# Patient Record
Sex: Female | Born: 1950 | Race: Black or African American | Hispanic: No | Marital: Married | Smoking: Never smoker
Health system: Southern US, Community
[De-identification: ages and names within clinical notes are randomized; demographics above are authoritative.]

## PROBLEM LIST (undated history)

## (undated) DIAGNOSIS — I10 Essential (primary) hypertension: Secondary | ICD-10-CM

## (undated) HISTORY — PX: URETHRAL CYST REMOVAL: SHX5128

## (undated) HISTORY — PX: OTHER SURGICAL HISTORY: SHX169

## (undated) HISTORY — DX: Essential (primary) hypertension: I10

---

## 2012-05-25 HISTORY — PX: KNEE ARTHROSCOPY WITH MENISCAL REPAIR: SHX5653

## 2015-12-30 ENCOUNTER — Emergency Department (HOSPITAL_COMMUNITY)
Admission: EM | Admit: 2015-12-30 | Discharge: 2015-12-31 | Disposition: A | Discharge status: Home / Self Care | Payer: BLUE CROSS/BLUE SHIELD | Attending: Emergency Medicine | Admitting: Emergency Medicine

## 2015-12-30 ENCOUNTER — Encounter (HOSPITAL_COMMUNITY): Payer: Self-pay | Admitting: Emergency Medicine

## 2015-12-30 DIAGNOSIS — I10 Essential (primary) hypertension: Secondary | ICD-10-CM | POA: Insufficient documentation

## 2015-12-30 NOTE — ED Triage Notes (Signed)
Pt. 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 Pt. added cervical neck problems in the past .

## 2015-12-30 NOTE — ED Provider Notes (Signed)
[facility] Provider Note   CSN: [id number] Arrival date & time: 12/30/15  2006  First Provider Contact:   First MD Initiated Contact with Patient 12/31/15 0002    By signing my name below, I, Freida Busmaniana Omoyeni, attest that this documentation has been prepared under the direction and in the presence of Tomasita CrumbleAdeleke Lekisha Mcghee, MD . Electronically Signed: Freida Busmaniana Omoyeni, Scribe. 12/30/2015. 11:56 PM.   History   Chief Complaint Chief Complaint  Patient presents with  . Hypertension  . Other    " funny feeling at backm of head"     The history is provided by the patient. No language interpreter was used.     HPI Comments:  Sharon Mack is a 65 y.o. female who presents to the Emergency Department complaining of tenderness in her posterior head. She states it does not feel like a HA. She has a h/o HTN and reports associated HTN  x ~1 week. She reports BPs in the 150s/90s. Pt has a h/o similar episodes of HTN when she gained weight a few years ago; states she was taken off BP meds after she lost the weight. She recently started taking the enalapril HCTZ again when these episodes began and also notes recent slight weight gain. She denies acute trauma/fall, and numbness weakness in her extremities.   History reviewed. No pertinent past medical history.  There are no active problems to display for this patient.   Past Surgical History:  Procedure Laterality Date  . KNEE SURGERY    . URETHRAL CYST REMOVAL      OB History    No data available       Home Medications    Prior to Admission medications   Not on File    Family History No family history on file.  Social History Social History  Substance Use Topics  . Smoking status: Never Smoker  . Smokeless tobacco: Not on file  . Alcohol use Yes     Allergies   Penicillins   Review of Systems Review of Systems  10 systems reviewed and all are negative for acute change except as noted in the HPI.  Physical  Exam Updated Vital Signs BP 149/98 (BP Location: Right Arm)   Pulse 86   Temp 98.5 F (36.9 C) (Oral)   Resp 16   Ht 5\' 7"  (1.702 m)   Wt 198 lb (89.8 kg)   SpO2 99%   BMI 31.01 kg/m   Physical Exam  Constitutional: She is oriented to person, place, and time. She appears well-developed and well-nourished. No distress.  HENT:  Head: Normocephalic and atraumatic.  Nose: Nose normal.  Mouth/Throat: Oropharynx is clear and moist. No oropharyngeal exudate.  Eyes: Conjunctivae and EOM are normal. Pupils are equal, round, and reactive to light. No scleral icterus.  Neck: Normal range of motion. Neck supple. No JVD present. No tracheal deviation present. No thyromegaly present.  Cardiovascular: Normal rate, regular rhythm and normal heart sounds.  Exam reveals no gallop and no friction rub.   No murmur heard. Pulmonary/Chest: Effort normal and breath sounds normal. No respiratory distress. She has no wheezes. She exhibits no tenderness.  Abdominal: Soft. Bowel sounds are normal. She exhibits no distension and no mass. There is no tenderness. There is no rebound and no guarding.  Musculoskeletal: Normal range of motion. She exhibits no edema or tenderness.  Lymphadenopathy:    She has no cervical adenopathy.  Neurological: She is alert and oriented to person, place, and time. No cranial  nerve deficit. She exhibits normal muscle tone.  Normal strength and sensation to all extremities Normal cerebellar testing  Skin: Skin is warm and dry. No rash noted. No erythema. No pallor.  Nursing note and vitals reviewed.    ED Treatments / Results  DIAGNOSTIC STUDIES:  Oxygen Saturation is 100% on RA, normal by my interpretation.    COORDINATION OF CARE:  12:12 AM Discussed treatment plan with pt at bedside and pt agreed to plan.  Labs (all labs ordered are listed, but only abnormal results are displayed) Labs Reviewed - No data to display  EKG  EKG Interpretation None        Radiology No results found.  Procedures Procedures   Medications Ordered in ED Medications - No data to display   Initial Impression / Assessment and Plan / ED Course  I have reviewed the triage vital signs and the nursing notes.  Pertinent labs & imaging results that were available during my care of the patient were reviewed by me and considered in my medical decision making (see chart for details).  Clinical Course    Patient presents to the ED for pain in the back of her head with elevated BP readings.  Pain is worse with palpation.  Neuro exam here is normal. Symptoms have been going on for 1 week.  I do not believe she has an intracranial abnormality.  Repeat BP is now 120/80 but she doubled her medication today.  Advised to start amlodipine tomorrow and daily, lifestyle changes, and PCP fu.  She demonstrates good understanding. She appears well and in NAD.  VS remain within her normal limits and she is safe for DC.  Final Clinical Impressions(s) / ED Diagnoses   Final diagnoses:  None    New Prescriptions New Prescriptions   No medications on file      I personally performed the services described in this documentation, which was scribed in my presence. The recorded information has been reviewed and is accurate.      Tomasita Crumble, MD 12/31/15 [phone]

## 2015-12-31 MED ORDER — AMLODIPINE BESYLATE 10 MG PO TABS: 10.0000 mg | ORAL_TABLET | Freq: Every day | ORAL | 0 refills | Status: DC

## 2016-01-01 ENCOUNTER — Telehealth: Payer: Self-pay | Admitting: *Deleted

## 2016-01-01 ENCOUNTER — Encounter: Payer: Self-pay | Admitting: *Deleted

## 2016-01-01 NOTE — Telephone Encounter (Signed)
Pre-Visit Call completed with patient and chart updated.   Pre-Visit Info documented in Specialty Comments under [employer].    

## 2016-01-02 ENCOUNTER — Encounter: Payer: Self-pay | Admitting: Family

## 2016-01-02 ENCOUNTER — Ambulatory Visit (INDEPENDENT_AMBULATORY_CARE_PROVIDER_SITE_OTHER): Payer: PRIVATE HEALTH INSURANCE | Admitting: Family

## 2016-01-02 VITALS — BP 132/75 | HR 92 | Temp 98.5°F | Ht 65.0 in | Wt 196.2 lb

## 2016-01-02 DIAGNOSIS — IMO0001 Reserved for inherently not codable concepts without codable children: Secondary | ICD-10-CM

## 2016-01-02 DIAGNOSIS — R635 Abnormal weight gain: Secondary | ICD-10-CM | POA: Diagnosis not present

## 2016-01-02 DIAGNOSIS — I10 Essential (primary) hypertension: Secondary | ICD-10-CM | POA: Diagnosis not present

## 2016-01-02 DIAGNOSIS — R030 Elevated blood-pressure reading, without diagnosis of hypertension: Secondary | ICD-10-CM

## 2016-01-02 MED ORDER — AMLODIPINE BESYLATE 10 MG PO TABS: 10.0000 mg | ORAL_TABLET | Freq: Every day | ORAL | 1 refills | Status: DC

## 2016-01-02 NOTE — Progress Notes (Signed)
Subjective:    Patient ID: Sharon Mack, female    DOB: July 28, 1950, 65 y.o.   MRN: [id number]  HPI  Ms. Sharon EdenSlater- Licorish is a 65 yr old female who presents today to establish care. Notes that she developed a tender spot on her right scalp.   She presented to the ED on 12/30/15 with HA.  Noted to have elevated blood pressure. Was started on amlodipine.   BP Readings from Last 3 Encounters:  01/02/16 132/75  12/31/15 133/93   Reports some weight gain.  Attributes this to eating out a lot while they were building their home.  Reports that she has been monitoring her blood pressure.  She was 160lbs 2 years ago.    Seeing a chiropractor for some chronic neck pain.  Has a lot of tense muscles in her neck/back.   Wt Readings from Last 3 Encounters:  01/02/16 196 lb 3.2 oz (89 kg)  12/30/15 198 lb (89.8 kg)    Review of Systems  Constitutional: Positive for unexpected weight change.  HENT: Negative for hearing loss and rhinorrhea.   Eyes: Negative for visual disturbance.  Respiratory: Negative for cough.   Cardiovascular: Negative for leg swelling.  Gastrointestinal: Negative for blood in stool, constipation and diarrhea.  Genitourinary: Negative for dysuria and frequency.  Musculoskeletal: Positive for back pain and neck pain.       Sees a chiropractor  Skin: Negative for rash.  Neurological: Negative for headaches.  Hematological: Negative for adenopathy.  Psychiatric/Behavioral:       Denies depression/anxiety   Past Medical History:  Diagnosis Date  . Hypertension      Social History   Social History  . Marital status: Married    Spouse name: N/A  . Number of children: N/A  . Years of education: N/A   Occupational History  . Not on file.   Social History Main Topics  . Smoking status: Never Smoker  . Smokeless tobacco: Not on file  . Alcohol use No  . Drug use: No  . Sexual activity: Not on file   Other Topics Concern  . Not on file   Social  History Narrative   Married 2016   Son lives in the [place], 3 grandchildren   Retired Publishing rights managernurse practitioner (OB/GYN)   Enjoys roller skating, travelling, some tennis, watching basketball, dancing at [employer]    Past Surgical History:  Procedure Laterality Date  . KNEE ARTHROSCOPY WITH MENISCAL REPAIR  2014  . Sub Urethral Cyst    . URETHRAL CYST REMOVAL      Family History  Problem Relation Age of Onset  . Alcohol abuse Mother     died from liver cirrhosis, at age 64    Allergies  Allergen Reactions  . Penicillins Rash    Current Outpatient Prescriptions on File Prior to Visit  Medication Sig Dispense Refill  . Cholecalciferol (VITAMIN D3 PO) Take by mouth.    . Ginkgo Biloba (GINKGO PO) Take by mouth.    . MELATONIN PO Take by mouth at bedtime as needed.    Marland Kitchen. VITAMIN E PO Take by mouth.    . Coenzyme Q10 (COQ10 PO) Take by mouth.    . Flaxseed, Linseed, (FLAXSEED OIL PO) Take by mouth.     No current facility-administered medications on file prior to visit.     BP 132/75   Pulse 92   Temp 98.5 F (36.9 C) (Oral)   Ht 5\' 5"  (1.651 m)   Wt 196  lb 3.2 oz (89 kg)   SpO2 92%   BMI 32.65 kg/m       Objective:   Physical Exam  Constitutional: She is oriented to person, place, and time. She appears well-developed and well-nourished.  HENT:  Head: Normocephalic and atraumatic.  Cardiovascular: Normal rate, regular rhythm and normal heart sounds.   No murmur heard. Pulmonary/Chest: Effort normal and breath sounds normal. No respiratory distress. She has no wheezes.  Neurological: She is alert and oriented to person, place, and time.  Psychiatric: She has a normal mood and affect. Her behavior is normal. Judgment and thought content normal.          Assessment & Plan:

## 2016-01-02 NOTE — Patient Instructions (Addendum)
Complete lab work prior to leaving.  Continue amlodipine. Continue your work on healthy diet, exercise and weight loss. Welcome to [facility]!

## 2016-01-02 NOTE — Progress Notes (Signed)
Pre visit review using our clinic tool,if applicable. No additional management support is needed unless otherwise documented below in the visit note.  

## 2016-01-03 LAB — BASIC METABOLIC PANEL
BUN: 17 mg/dL (ref 6–23)
CHLORIDE: 100 meq/L (ref 96–112)
CO2: 30 meq/L (ref 19–32)
Calcium: 9.7 mg/dL (ref 8.4–10.5)
Creatinine, Ser: 0.9 mg/dL (ref 0.40–1.20)
GFR: 81.15 mL/min (ref >60.00)
GLUCOSE: 92 mg/dL (ref 70–99)
POTASSIUM: 4.1 meq/L (ref 3.5–5.1)
SODIUM: 137 meq/L (ref 135–145)

## 2016-01-04 ENCOUNTER — Encounter: Payer: Self-pay | Admitting: Family

## 2016-01-05 DIAGNOSIS — R635 Abnormal weight gain: Secondary | ICD-10-CM | POA: Insufficient documentation

## 2016-01-05 DIAGNOSIS — IMO0001 Reserved for inherently not codable concepts without codable children: Secondary | ICD-10-CM | POA: Insufficient documentation

## 2016-01-05 DIAGNOSIS — R030 Elevated blood-pressure reading, without diagnosis of hypertension: Secondary | ICD-10-CM

## 2016-01-05 NOTE — Assessment & Plan Note (Signed)
Discussed diet/exercise and weight loss.  

## 2016-01-05 NOTE — Assessment & Plan Note (Signed)
BP stable on amlodipine. Continue same.  

## 2016-01-06 ENCOUNTER — Telehealth: Payer: Self-pay | Admitting: *Deleted

## 2016-01-06 ENCOUNTER — Telehealth: Payer: Self-pay | Admitting: Family

## 2016-01-06 MED ORDER — AMLODIPINE BESYLATE 10 MG PO TABS: 10.0000 mg | ORAL_TABLET | Freq: Every day | ORAL | 1 refills | Status: DC

## 2016-01-06 NOTE — Telephone Encounter (Signed)
Pt has called in several times very rude and unprofessional yelling at front staff in regards to a Rx. Pt says per [facility], PCP didn't sign off on Rx that was sent in at time of visit so they have been unable to fill. 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    Pt called back in again yelling at front staff because office manager wasn't available to speak with her.    I spoke with CMA, she stated that she will [facility] to [facility] and find out what's going on. Informed pt, she says that this is ridiculous but she is grateful.

## 2016-01-06 NOTE — Telephone Encounter (Signed)
Received call from front office that pt was on the phone upset that her prescription for amlodipine "had not been signed off" and mail order has been trying to reach us to verify Rx. I do not see that we have received fax or electronic verification request. Advised front office to let pt know we will contact [facility] to clarify and get Rx completed. Spoke with CSR that verified Rx was received and placed on hold as it was missing Provider signature. I asked if they had reached out to us to obtain this information and she was unable to confirm. States Rx is just in pending mode. Was transferred to Sal, [facility] and he states that fax they received had no Provider signature. Gave verbal to Sal. Notified pt. She expressed concern about problems with technology and that she has been very stressed for 6 hours while she was trying to get this straightened out. I apologized to the pt for the difficulty she had experienced but I have spoken with the pharmacist personally. She states [facility] told her they contacted us 4 times for clarification. I advised her that I was not able to confirm previous contact attempts and that this was the firest time I heard there was a problem with her prescription. Again apologized to the pt for the difficulty with her prescription.

## 2016-01-31 ENCOUNTER — Ambulatory Visit: Payer: BLUE CROSS/BLUE SHIELD | Admitting: Family Medicine

## 2016-05-25 ENCOUNTER — Other Ambulatory Visit: Payer: Self-pay | Admitting: Family

## 2016-07-26 ENCOUNTER — Other Ambulatory Visit: Payer: Self-pay | Admitting: Family

## 2017-02-08 ENCOUNTER — Encounter (HOSPITAL_COMMUNITY): Payer: Self-pay | Admitting: Emergency Medicine

## 2017-02-08 ENCOUNTER — Emergency Department (HOSPITAL_COMMUNITY)
Admission: EM | Admit: 2017-02-08 | Discharge: 2017-02-08 | Disposition: A | Discharge status: Home / Self Care | Payer: BLUE CROSS/BLUE SHIELD | Attending: Emergency Medicine | Admitting: Emergency Medicine

## 2017-02-08 ENCOUNTER — Emergency Department (HOSPITAL_COMMUNITY): Payer: BLUE CROSS/BLUE SHIELD

## 2017-02-08 DIAGNOSIS — I10 Essential (primary) hypertension: Secondary | ICD-10-CM | POA: Insufficient documentation

## 2017-02-08 DIAGNOSIS — M79672 Pain in left foot: Secondary | ICD-10-CM | POA: Diagnosis present

## 2017-02-08 NOTE — ED Notes (Signed)
Pt reports she was stepping off ladder and missed last 2 steps, she heard a "pop" in her L heel. No obvious deformity, no swelling.

## 2017-02-08 NOTE — ED Provider Notes (Signed)
[facility] Provider Note   CSN: [id number] Arrival date & time: 02/08/17  1907     History   Chief Complaint Chief Complaint  Patient presents with  . Foot Injury    HPI Sharon Mack is a 66 y.o. female.  66 year old female who presents with left heel pain. This evening the patient stepped off of a ladder and missed the last 2 steps. She felt a pop on the medial side of her left heel and she has had pain and swelling there ever since. She initially put ice on it and thought that it was fine but her friend encouraged her to come in. She has full ROM of foot and ankle, no ankle or leg pain. No other injuries. No hx of trauma to foot. Pain is mild and worse with palpation.   The history is provided by the patient.  Foot Injury   Pertinent negatives include no numbness.    Past Medical History:  Diagnosis Date  . Hypertension     Patient Active Problem List   Diagnosis Date Noted  . Elevated blood pressure 01/05/2016  . Weight gain 01/05/2016    Past Surgical History:  Procedure Laterality Date  . KNEE ARTHROSCOPY WITH MENISCAL REPAIR  2014  . Sub Urethral Cyst    . URETHRAL CYST REMOVAL      OB History    No data available       Home Medications    Prior to Admission medications   Medication Sig Start Date End Date Taking? Authorizing Provider  amLODipine (NORVASC) 10 MG tablet TAKE 1 TABLET DAILY 05/26/16   Sandford Craze, NP  Black Currant Seed Oil 500 MG CAPS Take by mouth.    [provider]  Cholecalciferol (VITAMIN D3 PO) Take by mouth.    [provider]  Coenzyme Q10 (COQ10 PO) Take by mouth.    [provider]  Flaxseed, Linseed, (FLAXSEED OIL PO) Take by mouth.    [provider]  Ginkgo Biloba (GINKGO PO) Take by mouth.    [provider]  MELATONIN PO Take by mouth at bedtime as needed.    [provider]  VITAMIN E PO Take by mouth.    [provider]    Family  History Family History  Problem Relation Age of Onset  . Alcohol abuse Mother        died from liver cirrhosis, at age 45    Social History Social History  Substance Use Topics  . Smoking status: Never Smoker  . Smokeless tobacco: Never Used  . Alcohol use No     Allergies   Penicillins   Review of Systems Review of Systems  Musculoskeletal: Negative for joint swelling.  Skin: Negative for color change and wound.  Neurological: Negative for numbness.     Physical Exam Updated Vital Signs BP (!) 142/90 (BP Location: Left Arm)   Pulse 82   Temp 98.3 F (36.8 C) (Oral)   Resp 18   Ht  (1.702 m)   Wt 88.9 kg (196 lb)   SpO2 99%   BMI 30.70 kg/m   Physical Exam  Constitutional: She is oriented to person, place, and time. She appears well-developed and well-nourished. No distress.  HENT:  Head: Normocephalic and atraumatic.  Musculoskeletal: Normal range of motion. She exhibits tenderness.  Achilles tendon intact, no proximal fibular tenderness, no base of 5th metatarsal tenderness, no midfoot instability, no tenderness of 1st MTP joint Mild edema and tenderness of  medial side of heel; no tenderness along plantar surface of foot  Neurological: She is alert and oriented to person, place, and time.  Normal sensation b/l lower extremities  Skin: Skin is warm and dry. No erythema.  Psychiatric: She has a normal mood and affect. Judgment normal.  Nursing note and vitals reviewed.    ED Treatments / Results  Labs (all labs ordered are listed, but only abnormal results are displayed) Labs Reviewed - No data to display  EKG  EKG Interpretation None       Radiology Dg Foot Complete Left  Result Date: 02/08/2017 CLINICAL DATA:  Patient stepped off ladder today and heard a pop on the medial side of her left foot, medial left foot pain and minor swelling. EXAM: LEFT FOOT - COMPLETE 3+ VIEW COMPARISON:  None. FINDINGS: There are small slivers of bone along  medial margin of the base of the proximal phalanx the great toe consistent acute avulsion fractures. Mild associated soft tissue swelling. No other evidence of a fracture. There is deformity of the PIP joints of third, fourth and fifth toes, most prominently the fifth toe, which appears chronic likely developmental. Remaining joints are normally spaced and aligned IMPRESSION: 1. Small avulsion fracture from the medial base of the proximal phalanx of the left great toe. No dislocation. No other acute abnormalities. Electronically Signed   By: Amie Portland M.D.   On: 02/08/2017 19:37    Procedures Procedures (including critical care time)  Medications Ordered in ED Medications - No data to display   Initial Impression / Assessment and Plan / ED Course  I have reviewed the triage vital signs and the nursing notes.  Pertinent imaging results that were available during my care of the patient were reviewed by me and considered in my medical decision making (see chart for details).     L heel pain, mild tenderness on exam. No joint pain and normal ROM at ankle. XR suggests avulsion fx at base of proximal phalanx of great toe but patient has no tenderness at this area and full ROM of toe therefore I do not suspect injury today in this area. Placed in ASO and discussed f/u with sports medicine doc in [place] whom she has seen in past, or with podiatry here as needed. Discussed supportive measures.  Final Clinical Impressions(s) / ED Diagnoses   Final diagnoses:  Pain of left heel    New Prescriptions New Prescriptions   No medications on file     Rhyse Skowron, Ambrose Finland, MD 02/08/17 2041

## 2018-03-10 ENCOUNTER — Encounter (HOSPITAL_COMMUNITY): Payer: Self-pay | Admitting: Emergency Medicine

## 2018-03-10 ENCOUNTER — Other Ambulatory Visit: Payer: Self-pay

## 2018-03-10 ENCOUNTER — Ambulatory Visit (HOSPITAL_COMMUNITY)
Admission: EM | Admit: 2018-03-10 | Discharge: 2018-03-10 | Disposition: A | Discharge status: Home / Self Care | Payer: Medicare Other | Attending: Family Medicine | Admitting: Family Medicine

## 2018-03-10 DIAGNOSIS — I10 Essential (primary) hypertension: Secondary | ICD-10-CM

## 2018-03-10 DIAGNOSIS — F419 Anxiety disorder, unspecified: Secondary | ICD-10-CM

## 2018-03-10 DIAGNOSIS — M542 Cervicalgia: Secondary | ICD-10-CM

## 2018-03-10 LAB — POCT I-STAT, CHEM 8
BUN: 10 mg/dL (ref 8–23)
CALCIUM ION: 1.17 mmol/L (ref 1.15–1.40)
CREATININE: 0.8 mg/dL (ref 0.44–1.00)
Chloride: 104 mmol/L (ref 98–111)
GLUCOSE: 87 mg/dL (ref 70–99)
HCT: 42 % (ref 36.0–46.0)
HEMOGLOBIN: 14.3 g/dL (ref 12.0–15.0)
Potassium: 3.5 mmol/L (ref 3.5–5.1)
Sodium: 141 mmol/L (ref 135–145)
TCO2: 29 mmol/L (ref 22–32)

## 2018-03-10 MED ORDER — IBUPROFEN 800 MG PO TABS
800.0000 mg | ORAL_TABLET | Freq: Once | ORAL | Status: AC
  Administered 2018-03-10: 800 mg via ORAL

## 2018-03-10 MED ORDER — IBUPROFEN 800 MG PO TABS: 800.0000 mg | ORAL_TABLET | Freq: Three times a day (TID) | ORAL | 0 refills | Status: AC

## 2018-03-10 MED ORDER — ENALAPRIL MALEATE 20 MG PO TABS: 20.0000 mg | ORAL_TABLET | Freq: Every day | ORAL | 0 refills | Status: AC

## 2018-03-10 MED ORDER — IBUPROFEN 800 MG PO TABS
ORAL_TABLET | ORAL | Status: AC
  Filled 2018-03-10: qty 1

## 2018-03-10 NOTE — ED Triage Notes (Addendum)
Patient reports being under a lot of stress and is concerned for anxiety and no chest or back pain, but neck soreness.  Patient noticed feeling differently yesterday

## 2018-03-15 NOTE — ED Provider Notes (Signed)
[facility]   [id number] 03/10/18 Arrival Time: 1535  ASSESSMENT & PLAN:  1. Essential hypertension   2. Neck discomfort    ECG with sinus rhythm. No ST elevation. No worrisome findings.  Meds ordered this encounter  Medications  . ibuprofen (ADVIL,MOTRIN) tablet 800 mg  . ibuprofen (ADVIL,MOTRIN) 800 MG tablet    Sig: Take 1 tablet (800 mg total) by mouth 3 (three) times daily.    Dispense:  21 tablet    Refill:  0  . enalapril (VASOTEC) 20 MG tablet    Sig: Take 1 tablet (20 mg total) by mouth daily.    Dispense:  30 tablet    Refill:  0   Increase enalapril to 20mg  daily. Cr normal today. Ibuprofen for neck discomfort. Could be related to BP as per history but may be muscle related. Her anxiety seems to be controlled. Will call to schedule f/u with her PCP within the next 1-2 weeks. May f/u here as needed.  Reviewed expectations re: course of current medical issues. Questions answered. Outlined signs and symptoms indicating need for more acute intervention. Patient verbalized understanding. After Visit Summary given.   SUBJECTIVE:  Sharon Mack is a 67 y.o. female who presents with concerns regarding increased blood pressures. She reports that she has been treated for hypertension in the past. Did take an extra enalapril today. Usu takes 5mg  daily. Has increased to 10mg  sporadically and more frequently lately. Reports that she experiences posterior neck discomfort when her blood pressure goes up. No neck injury or trauma. No visual changes or headaches reported. No n/v. Ambulatory without assistance. No extremity sensation changes or weakness. Normal balance without vertigo. Normal hearing. No new medications. Does admit that she has been under "a lot of stress lately". Some anxiety. No back pain. Normal bowel/bladder habits. No CP/SOB. No OTC medications taken recently.  She reports taking medications as instructed, no medication side effects noted, no  TIA's, no chest pain on exertion, no dyspnea on exertion and no swelling of ankles.  Denies symptoms of chest pain, palpations, orthopnea, nocturnal dyspnea, or LE edema.  Social History   Tobacco Use  Smoking Status Never Smoker  Smokeless Tobacco Never Used    ROS: As per HPI. All other systems negative.    OBJECTIVE:  Vitals:   03/10/18 1601  BP: (!) 163/99  Pulse: 80  Resp: 18  Temp: 97.7 F (36.5 C)  TempSrc: Oral  SpO2: 99%    General appearance: alert; no distress Eyes: PERRLA; EOMI HENT: normocephalic; atraumatic Neck: supple without LAD; some tenderness to palpation over L neck musculature; no midline tenderness Lungs: clear to auscultation bilaterally Heart: regular rate and rhythm without murmer Abdomen: soft, non-tender; bowel sounds normal; no masses or organomegaly; no guarding or rebound tenderness Extremities: no cyanosis or edema; symmetrical with no gross deformities Skin: warm and dry Psychological: alert and cooperative; normal mood and affect  ECG: Orders placed or performed during the hospital encounter of 03/10/18  . ED EKG  . ED EKG    Labs: Results for orders placed or performed during the hospital encounter of 03/10/18  I-STAT, chem 8  Result Value Ref Range   Sodium 141 135 - 145 mmol/L   Potassium 3.5 3.5 - 5.1 mmol/L   Chloride 104 98 - 111 mmol/L   BUN 10 8 - 23 mg/dL   Creatinine, Ser 0.86 0.44 - 1.00 mg/dL   Glucose, Bld 87 70 - 99 mg/dL   Calcium, Ion 5.78 1.15 -  1.40 mmol/L   TCO2 29 22 - 32 mmol/L   Hemoglobin 14.3 12.0 - 15.0 g/dL   HCT 16.1 09.6 - 04.5 %   Labs Reviewed  POCT I-STAT, CHEM 8    Allergies  Allergen Reactions  . Penicillins Rash    Past Medical History:  Diagnosis Date  . Hypertension    Social History   Socioeconomic History  . Marital status: Married    Spouse name: Not on file  . Number of children: Not on file  . Years of education: Not on file  . Highest education level: Not on file    Occupational History  . Not on file  Social Needs  . Financial resource strain: Not on file  . Food insecurity:    Worry: Not on file    Inability: Not on file  . Transportation needs:    Medical: Not on file    Non-medical: Not on file  Tobacco Use  . Smoking status: Never Smoker  . Smokeless tobacco: Never Used  Substance and Sexual Activity  . Alcohol use: No  . Drug use: No  . Sexual activity: Not on file  Lifestyle  . Physical activity:    Days per week: Not on file    Minutes per session: Not on file  . Stress: Not on file  Relationships  . Social connections:    Talks on phone: Not on file    Gets together: Not on file    Attends religious service: Not on file    Active member of club or organization: Not on file    Attends meetings of clubs or organizations: Not on file    Relationship status: Not on file  . Intimate partner violence:    Fear of current or ex partner: Not on file    Emotionally abused: Not on file    Physically abused: Not on file    Forced sexual activity: Not on file  Other Topics Concern  . Not on file  Social History Narrative   Married 2016   Son lives in the [place], 3 grandchildren   Retired Publishing rights manager (OB/GYN)   Enjoys roller skating, travelling, some tennis, watching basketball, dancing at [employer]   Family History  Problem Relation Age of Onset  . Alcohol abuse Mother        died from liver cirrhosis, at age 2   Past Surgical History:  Procedure Laterality Date  . KNEE ARTHROSCOPY WITH MENISCAL REPAIR  2014  . Sub Urethral Cyst    . URETHRAL CYST REMOVAL        Mardella Layman, MD 03/15/18 1019

## 2019-05-01 ENCOUNTER — Emergency Department (HOSPITAL_COMMUNITY): Payer: Medicare Other

## 2019-05-01 ENCOUNTER — Encounter (HOSPITAL_COMMUNITY): Payer: Self-pay | Admitting: Emergency Medicine

## 2019-05-01 ENCOUNTER — Other Ambulatory Visit: Payer: Self-pay

## 2019-05-01 ENCOUNTER — Emergency Department (HOSPITAL_COMMUNITY)
Admission: EM | Admit: 2019-05-01 | Discharge: 2019-05-01 | Disposition: A | Discharge status: Home / Self Care | Payer: Medicare Other | Attending: Emergency Medicine | Admitting: Emergency Medicine

## 2019-05-01 DIAGNOSIS — M25562 Pain in left knee: Secondary | ICD-10-CM | POA: Insufficient documentation

## 2019-05-01 DIAGNOSIS — M25561 Pain in right knee: Secondary | ICD-10-CM | POA: Diagnosis not present

## 2019-05-01 DIAGNOSIS — M1990 Unspecified osteoarthritis, unspecified site: Secondary | ICD-10-CM | POA: Insufficient documentation

## 2019-05-01 DIAGNOSIS — I10 Essential (primary) hypertension: Secondary | ICD-10-CM | POA: Insufficient documentation

## 2019-05-01 DIAGNOSIS — Z79899 Other long term (current) drug therapy: Secondary | ICD-10-CM | POA: Diagnosis not present

## 2019-05-01 LAB — BASIC METABOLIC PANEL
Anion gap: 9 (ref 5–15)
BUN: 9 mg/dL (ref 8–23)
CO2: 24 mmol/L (ref 22–32)
Calcium: 9.2 mg/dL (ref 8.9–10.3)
Chloride: 106 mmol/L (ref 98–111)
Creatinine, Ser: 0.75 mg/dL (ref 0.44–1.00)
GFR calc Af Amer: >60 mL/min (ref >60)
GFR calc non Af Amer: >60 mL/min (ref >60)
Glucose, Bld: 102 mg/dL — ABNORMAL HIGH (ref 70–99)
Potassium: 3.7 mmol/L (ref 3.5–5.1)
Sodium: 139 mmol/L (ref 135–145)

## 2019-05-01 LAB — CBC WITH DIFFERENTIAL/PLATELET
Abs Immature Granulocytes: 0.01 10*3/uL (ref 0.00–0.07)
Basophils Absolute: 0 10*3/uL (ref 0.0–0.1)
Basophils Relative: 1 %
Eosinophils Absolute: 0 10*3/uL (ref 0.0–0.5)
Eosinophils Relative: 0 %
HCT: 43.5 % (ref 36.0–46.0)
Hemoglobin: 13.9 g/dL (ref 12.0–15.0)
Immature Granulocytes: 0 %
Lymphocytes Relative: 17 %
Lymphs Abs: 0.9 10*3/uL (ref 0.7–4.0)
MCH: 27.5 pg (ref 26.0–34.0)
MCHC: 32 g/dL (ref 30.0–36.0)
MCV: 86.1 fL (ref 80.0–100.0)
Monocytes Absolute: 0.4 10*3/uL (ref 0.1–1.0)
Monocytes Relative: 8 %
Neutro Abs: 3.8 10*3/uL (ref 1.7–7.7)
Neutrophils Relative %: 74 %
Platelets: 378 10*3/uL (ref 150–400)
RBC: 5.05 MIL/uL (ref 3.87–5.11)
RDW: 12.5 % (ref 11.5–15.5)
WBC: 5.2 10*3/uL (ref 4.0–10.5)
nRBC: 0 % (ref 0.0–0.2)

## 2019-05-01 LAB — SYNOVIAL CELL COUNT + DIFF, W/ CRYSTALS
Crystals, Fluid: NONE SEEN
Lymphocytes-Synovial Fld: 3 % (ref 0–20)
Monocyte-Macrophage-Synovial Fluid: 7 % — ABNORMAL LOW (ref 50–90)
Neutrophil, Synovial: 90 % — ABNORMAL HIGH (ref 0–25)
WBC, Synovial: 9800 /mm3 — ABNORMAL HIGH (ref 0–200)

## 2019-05-01 LAB — GRAM STAIN: Special Requests: NORMAL

## 2019-05-01 MED ORDER — LIDOCAINE-EPINEPHRINE (PF) 2 %-1:200000 IJ SOLN
20.0000 mL | Freq: Once | INTRAMUSCULAR | Status: AC
  Administered 2019-05-01: 20 mL
  Filled 2019-05-01: qty 20

## 2019-05-01 MED ORDER — ACETAMINOPHEN-CODEINE #3 300-30 MG PO TABS: 1.0000 | ORAL_TABLET | Freq: Four times a day (QID) | ORAL | 0 refills | Status: AC | PRN

## 2019-05-01 MED ORDER — PREDNISONE 20 MG PO TABS: ORAL_TABLET | ORAL | 0 refills | Status: AC

## 2019-05-01 MED ORDER — LIDOCAINE-EPINEPHRINE 2 %-1:100000 IJ SOLN: 20.0000 mL | Freq: Once | INTRAMUSCULAR | Status: DC

## 2019-05-01 MED ORDER — MORPHINE SULFATE (PF) 4 MG/ML IV SOLN
4.0000 mg | Freq: Once | INTRAVENOUS | Status: AC
  Administered 2019-05-01: 4 mg via INTRAVENOUS
  Filled 2019-05-01: qty 1

## 2019-05-01 NOTE — ED Provider Notes (Signed)
Received signout at the beginning of shift, please refer to previous providers note for complete H&P.  Patient recently received bilateral PRP knee injection 5 days ago presenting with gradual worsening bilateral knee pain and swelling left greater than right.  She had a left knee arthrocentesis with synovial fluid sent to rule out septic joint.  Synovial cell counts returns with WBC 9800 as well as greater than 90 neutrophil.  Finding does not suggest septic arthritis.  Patient given pain medication here, will prescribe steroid medication for the next few days and encourage patient to follow-up closely with her orthopedist, Dr. Nadara Mustard, for additional management of her bilateral knee pain and swelling.  BP (!) 155/76   Pulse 93   Temp 98.2 F (36.8 C) (Oral)   Resp 17   Ht 5\' 7"  (1.702 m)   Wt 89.4 kg   SpO2 98%   BMI 30.85 kg/m   Results for orders placed or performed during the hospital encounter of 05/01/19  Gram stain   Specimen: Joint, Knee; Body Fluid  Result Value Ref Range   Specimen Description JOINT FLUID KNEE    Special Requests Normal    Gram Stain      ABUNDANT WBC PRESENT,BOTH PMN AND MONONUCLEAR NO ORGANISMS SEEN Performed at [facility], [place] [address], [place],  [address]    Report Status 05/01/2019 FINAL   CBC with Differential  Result Value Ref Range   WBC 5.2 4.0 - 10.5 K/uL   RBC 5.05 3.87 - 5.11 MIL/uL   Hemoglobin 13.9 12.0 - 15.0 g/dL   HCT 43.5 36.0 - 46.0 %   MCV 86.1 80.0 - 100.0 fL   MCH 27.5 26.0 - 34.0 pg   MCHC 32.0 30.0 - 36.0 g/dL   RDW 12.5 11.5 - 15.5 %   Platelets 378 150 - 400 K/uL   nRBC 0.0 0.0 - 0.2 %   Neutrophils Relative % 74 %   Neutro Abs 3.8 1.7 - 7.7 K/uL   Lymphocytes Relative 17 %   Lymphs Abs 0.9 0.7 - 4.0 K/uL   Monocytes Relative 8 %   Monocytes Absolute 0.4 0.1 - 1.0 K/uL   Eosinophils Relative 0 %   Eosinophils Absolute 0.0 0.0 - 0.5 K/uL   Basophils Relative 1 %   Basophils Absolute 0.0 0.0 - 0.1  K/uL   Immature Granulocytes 0 %   Abs Immature Granulocytes 0.01 0.00 - 0.07 K/uL  Basic metabolic panel  Result Value Ref Range   Sodium 139 135 - 145 mmol/L   Potassium 3.7 3.5 - 5.1 mmol/L   Chloride 106 98 - 111 mmol/L   CO2 24 22 - 32 mmol/L   Glucose, Bld 102 (H) 70 - 99 mg/dL   BUN 9 8 - 23 mg/dL   Creatinine, Ser 0.75 0.44 - 1.00 mg/dL   Calcium 9.2 8.9 - 10.3 mg/dL   GFR calc non Af Amer >60 >60 mL/min   GFR calc Af Amer >60 >60 mL/min   Anion gap 9 5 - 15  Synovial cell count + diff, w/ crystals  Result Value Ref Range   Color, Synovial YELLOW (A) YELLOW   Appearance-Synovial CLOUDY (A) CLEAR   Crystals, Fluid NO CRYSTALS SEEN    WBC, Synovial 9,800 (H) 0 - 200 /cu mm   Neutrophil, Synovial 90 (H) 0 - 25 %   Lymphocytes-Synovial Fld 3 0 - 20 %   Monocyte-Macrophage-Synovial Fluid 7 (L) 50 - 90 %   Dg  Knee Complete 4 Views Left  Result Date: 05/01/2019 CLINICAL DATA:  Pain and swelling after PRP injection. EXAM: LEFT KNEE - COMPLETE 4+ VIEW COMPARISON:  None. FINDINGS: Moderate to large knee joint effusion visible on the lateral view. Osteoarthritis of the patellofemoral joint is present. Weight-bearing compartments show normal height without osteophytes or irregularity. No other focal bone finding. IMPRESSION: Moderate to large joint effusion. Osteoarthritis of the patellofemoral joint. Electronically Signed   By: Paulina Fusi M.D.   On: 05/01/2019 11:19   Dg Knee Complete 4 Views Right  Result Date: 05/01/2019 CLINICAL DATA:  Pain EXAM: RIGHT KNEE - COMPLETE 4+ VIEW COMPARISON:  None. FINDINGS: Alignment is anatomic. A joint effusion is present. There is no acute fracture. Tricompartmental changes of osteoarthritis are present with joint space narrowing greatest in the medial compartment. IMPRESSION: Joint effusion. Tricompartmental osteoarthritis. Electronically Signed   By: Guadlupe Spanish M.D.   On: 05/01/2019 11:48        Fayrene Helper, PA-C 05/01/19 1830     Tilden Fossa, MD 05/02/19 1425

## 2019-05-01 NOTE — ED Notes (Signed)
Pt ambulatory to restroom with cane, steady gait.

## 2019-05-01 NOTE — ED Triage Notes (Signed)
Pt states she had PRP done to her L knee last Wednesday, had some pain and redness initially around injection site that she thought would pass, did ice and tylenol but states and and swelling have increased and she can hardly stand on her L leg.

## 2019-05-01 NOTE — Discharge Instructions (Signed)
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  Please take prednisone for the next few days as it will help with the inflammation.  Call and follow-up closely with orthopedics for further management of condition.

## 2019-05-01 NOTE — ED Notes (Signed)
Pt wheeled to the bathroom.

## 2019-05-01 NOTE — ED Notes (Signed)
Pt has 2+ pedal pulses, sensation intact, pt able to wiggle toes. Pt has 2+ edema of knees bilat.

## 2019-05-01 NOTE — ED Provider Notes (Signed)
[facility] EMERGENCY DEPARTMENT Provider Note   CSN: [id number] Arrival date & time: 05/01/19  1013     History   Chief Complaint Chief Complaint  Patient presents with  . Knee Pain    HPI Sharon Mack is a 68 y.o. female presenting for evaluation of bilateral knee pain.   Pt states 5 days ago she received bilateral PRP knee injections.  Since then, she has gradually worsening knee pain and swelling.  Symptoms are worse in the left knee.  She talked to the doctor give her the injections, recommend she come to the ED for joint aspiration to rule out infection.  She denies fevers, chills, nausea, vomiting, generalized weakness.  She has a history of high blood pressure, no other medical problems.  She denies a history of diabetes.  She denies numbness or tingling.  She denies fall, trauma, or injury.  She does not have an orthopedic doctor.  She is not on blood thinners.     HPI  Past Medical History:  Diagnosis Date  . Hypertension     Patient Active Problem List   Diagnosis Date Noted  . Elevated blood pressure 01/05/2016  . Weight gain 01/05/2016    Past Surgical History:  Procedure Laterality Date  . KNEE ARTHROSCOPY WITH MENISCAL REPAIR  2014  . Sub Urethral Cyst    . URETHRAL CYST REMOVAL       OB History   No obstetric history on file.      Home Medications    Prior to Admission medications   Medication Sig Start Date End Date Taking? Authorizing Provider  Black Currant Seed Oil 500 MG CAPS Take by mouth.    [provider]  Cholecalciferol (VITAMIN D3 PO) Take by mouth.    [provider]  Coenzyme Q10 (COQ10 PO) Take by mouth.    [provider]  enalapril (VASOTEC) 20 MG tablet Take 1 tablet (20 mg total) by mouth daily. 03/10/18   Vanessa Kick, MD  enalapril (VASOTEC) 5 MG tablet Take 5 mg by mouth daily.    [provider]  Flaxseed, Linseed, (FLAXSEED OIL PO) Take by mouth.     [provider]  Ginkgo Biloba (GINKGO PO) Take by mouth.    [provider]  ibuprofen (ADVIL,MOTRIN) 800 MG tablet Take 1 tablet (800 mg total) by mouth 3 (three) times daily. 03/10/18   Vanessa Kick, MD  MELATONIN PO Take by mouth at bedtime as needed.    [provider]  VITAMIN E PO Take by mouth.    [provider]    Family History Family History  Problem Relation Age of Onset  . Alcohol abuse Mother        died from liver cirrhosis, at age 56    Social History Social History   Tobacco Use  . Smoking status: Never Smoker  . Smokeless tobacco: Never Used  Substance Use Topics  . Alcohol use: No  . Drug use: No     Allergies   Penicillins   Review of Systems Review of Systems  Musculoskeletal: Positive for arthralgias and joint swelling.  All other systems reviewed and are negative.    Physical Exam Updated Vital Signs BP (!) 169/95 (BP Location: Right Arm)   Pulse (!) 107   Temp 98.2 F (36.8 C) (Oral)   Resp 17   Ht '5\' 7"'  (1.702 m)   Wt 89.4 kg   SpO2 100%   BMI 30.85 kg/m  Physical Exam Vitals signs and nursing note reviewed.  Constitutional:      General: She is not in acute distress.    Appearance: She is well-developed.     Comments: Resting comfortably in bed, no acute distress  HENT:     Head: Normocephalic and atraumatic.  Eyes:     Extraocular Movements: Extraocular movements intact.     Conjunctiva/sclera: Conjunctivae normal.     Pupils: Pupils are equal, round, and reactive to light.  Neck:     Musculoskeletal: Normal range of motion and neck supple.  Cardiovascular:     Rate and Rhythm: Regular rhythm. Tachycardia present.     Pulses: Normal pulses.     Comments: Mildly tachycardic on my exam at 105 Pulmonary:     Effort: Pulmonary effort is normal. No respiratory distress.     Breath sounds: Normal breath sounds. No wheezing.  Abdominal:     General: There is no distension.      Palpations: Abdomen is soft. There is no mass.     Tenderness: There is no abdominal tenderness. There is no guarding or rebound.  Musculoskeletal:        General: Swelling and tenderness present.     Comments: Mild swelling and tenderness to bilateral knees.  Left greater than right.  No erythema, there is warmth noted bilaterally.  Pedal pulses intact.  No lower leg swelling or tenderness.  Skin:    General: Skin is warm and dry.     Capillary Refill: Capillary refill takes less than 2 seconds.  Neurological:     Mental Status: She is alert and oriented to person, place, and time.      ED Treatments / Results  Labs (all labs ordered are listed, but only abnormal results are displayed) Labs Reviewed  BODY FLUID CULTURE  GRAM STAIN  CBC WITH DIFFERENTIAL/PLATELET  BASIC METABOLIC PANEL  SEDIMENTATION RATE  C-REACTIVE PROTEIN  GLUCOSE, BODY FLUID OTHER  PROTEIN, BODY FLUID (OTHER)  SYNOVIAL CELL COUNT + DIFF, W/ CRYSTALS    EKG None  Radiology Dg Knee Complete 4 Views Left  Result Date: 05/01/2019 CLINICAL DATA:  Pain and swelling after PRP injection. EXAM: LEFT KNEE - COMPLETE 4+ VIEW COMPARISON:  None. FINDINGS: Moderate to large knee joint effusion visible on the lateral view. Osteoarthritis of the patellofemoral joint is present. Weight-bearing compartments show normal height without osteophytes or irregularity. No other focal bone finding. IMPRESSION: Moderate to large joint effusion. Osteoarthritis of the patellofemoral joint. Electronically Signed   By: Nelson Chimes M.D.   On: 05/01/2019 11:19   Dg Knee Complete 4 Views Right  Result Date: 05/01/2019 CLINICAL DATA:  Pain EXAM: RIGHT KNEE - COMPLETE 4+ VIEW COMPARISON:  None. FINDINGS: Alignment is anatomic. A joint effusion is present. There is no acute fracture. Tricompartmental changes of osteoarthritis are present with joint space narrowing greatest in the medial compartment. IMPRESSION: Joint effusion.  Tricompartmental osteoarthritis. Electronically Signed   By: Macy Mis M.D.   On: 05/01/2019 11:48    Procedures .Joint Aspiration/Arthrocentesis  Date/Time: 05/01/2019 2:50 PM Performed by: Franchot Heidelberg, PA-C Authorized by: Franchot Heidelberg, PA-C   Consent:    Consent obtained:  Verbal   Consent given by:  Patient   Risks discussed:  Bleeding, incomplete drainage, infection, nerve damage, pain and poor cosmetic result   Alternatives discussed:  Alternative treatment Location:    Location:  Knee   Knee:  L knee Anesthesia (see MAR for exact dosages):    Anesthesia  method:  Local infiltration   Local anesthetic:  Lidocaine 2% WITH epi Procedure details:    Preparation: Patient was prepped and draped in usual sterile fashion     Needle gauge:  18 G   Ultrasound guidance: no     Approach:  Lateral   Aspirate amount:  40   Aspirate characteristics:  Serous and yellow   Steroid injected: no     Specimen collected: yes   Post-procedure details:    Dressing:  Adhesive bandage   Patient tolerance of procedure:  Tolerated well, no immediate complications   (including critical care time)  Medications Ordered in ED Medications  lidocaine-EPINEPHrine (XYLOCAINE W/EPI) 2 %-1:200000 (PF) injection 20 mL (has no administration in time range)     Initial Impression / Assessment and Plan / ED Course  I have reviewed the triage vital signs and the nursing notes.  Pertinent labs & imaging results that were available during my care of the patient were reviewed by me and considered in my medical decision making (see chart for details).        Presenting for evaluation bilateral knee pain and swelling.  Physical exam shows patient appears nontoxic.  She is mildly tachycardic, though this improved without intervention.  Likely anxiety and pain related.  While I have a lower suspicion for infection as symptoms are bilateral, and patient without systemic symptoms of infection such  as fever and chills, consider infection due to recent knee injection and worsening pain.  Discussed with provider who gave injections, who is requesting arthrocentesis for testing.  X-rays obtained from triage show a moderate to large joint effusion of the left, small effusion of the right.   There are centesis performed as described above.  Will obtain labs including ESR and CRP.  Synovial fluid sent for testing.  Patient signed out to be Haywood Pao for follow-up on arthrocentesis results and lab results.  If show sign for infection, will likely need to be admitted.  If not, can be treated for inflammation and follow-up with doctor.   Final Clinical Impressions(s) / ED Diagnoses   Final diagnoses:  None    ED Discharge Orders    None       Franchot Heidelberg, PA-C 05/01/19 [place], DO 05/02/19 [id number]

## 2019-05-02 LAB — PROTEIN, BODY FLUID (OTHER): Total Protein, Body Fluid Other: 4 g/dL

## 2019-05-02 LAB — GLUCOSE, BODY FLUID OTHER: Glucose, Body Fluid Other: 48 mg/dL

## 2019-05-06 LAB — CULTURE, BODY FLUID W GRAM STAIN -BOTTLE: Culture: NO GROWTH

## 2019-12-08 ENCOUNTER — Ambulatory Visit (HOSPITAL_COMMUNITY)
Admission: EM | Admit: 2019-12-08 | Discharge: 2019-12-08 | Disposition: A | Discharge status: Home / Self Care | Payer: Medicare Other | Attending: Physician Assistant | Admitting: Physician Assistant

## 2019-12-08 ENCOUNTER — Other Ambulatory Visit: Payer: Self-pay

## 2019-12-08 ENCOUNTER — Encounter (HOSPITAL_COMMUNITY): Payer: Self-pay

## 2019-12-08 DIAGNOSIS — J0100 Acute maxillary sinusitis, unspecified: Secondary | ICD-10-CM

## 2019-12-08 MED ORDER — DOXYCYCLINE HYCLATE 100 MG PO CAPS: 100.0000 mg | ORAL_CAPSULE | Freq: Two times a day (BID) | ORAL | 0 refills | Status: AC

## 2019-12-08 MED ORDER — TERCONAZOLE 0.8 % VA CREA: 1.0000 | TOPICAL_CREAM | Freq: Every day | VAGINAL | 0 refills | Status: AC

## 2019-12-08 MED ORDER — FLUTICASONE PROPIONATE 50 MCG/ACT NA SUSP: 1.0000 | Freq: Every day | NASAL | 2 refills | Status: AC

## 2019-12-08 NOTE — ED Notes (Signed)
Refused BP testing.

## 2019-12-08 NOTE — ED Triage Notes (Signed)
Pt presents with pain and sinus pressure,difficulty breathing x 1 week.

## 2019-12-08 NOTE — ED Provider Notes (Signed)
[facility]    CSN: [id number] Arrival date & time: 12/08/19  1832      History   Chief Complaint Chief Complaint  Patient presents with  . Sinus Problem    HPI Sharon Mack is a 69 y.o. female.   Patient reports for 1 week of sinus congestion.  She reports she has had significant sinus congestion that is been worsening over the last 1 week.  She reports is very difficult to breathe through her nose.  She reports she has right-sided facial pain.  She has occasional sneezing.  She has tried Zyrtec without any relief.  She has not yet started Sudafed and wanted to be evaluated prior to starting this.  She has not any fevers, chills.  She does report postnasal drip.  Denies cough.  Reports she is received both her Covid vaccines.  No sick contacts.     Past Medical History:  Diagnosis Date  . Hypertension     Patient Active Problem List   Diagnosis Date Noted  . Elevated blood pressure 01/05/2016  . Weight gain 01/05/2016    Past Surgical History:  Procedure Laterality Date  . KNEE ARTHROSCOPY WITH MENISCAL REPAIR  2014  . Sub Urethral Cyst    . URETHRAL CYST REMOVAL      OB History   No obstetric history on file.      Home Medications    Prior to Admission medications   Medication Sig Start Date End Date Taking? Authorizing Provider  acetaminophen-codeine (TYLENOL #3) 300-30 MG tablet Take 1 tablet by mouth every 6 (six) hours as needed for moderate pain. 05/01/19   Fayrene Helper, PA-C  Black Currant Seed Oil 500 MG CAPS Take by mouth.    [provider]  Cholecalciferol (VITAMIN D3 PO) Take by mouth.    [provider]  Coenzyme Q10 (COQ10 PO) Take by mouth.    [provider]  doxycycline (VIBRAMYCIN) 100 MG capsule Take 1 capsule (100 mg total) by mouth 2 (two) times daily. 12/08/19   Vasiliki Smaldone, Veryl Speak, PA-C  enalapril (VASOTEC) 20 MG tablet Take 1 tablet (20 mg total) by mouth daily. 03/10/18   Mardella Layman, MD    enalapril (VASOTEC) 5 MG tablet Take 5 mg by mouth daily.    [provider]  Flaxseed, Linseed, (FLAXSEED OIL PO) Take by mouth.    [provider]  fluticasone (FLONASE) 50 MCG/ACT nasal spray Place 1 spray into both nostrils daily. 12/08/19   Icholas Irby, Veryl Speak, PA-C  Ginkgo Biloba (GINKGO PO) Take by mouth.    [provider]  ibuprofen (ADVIL,MOTRIN) 800 MG tablet Take 1 tablet (800 mg total) by mouth 3 (three) times daily. 03/10/18   Mardella Layman, MD  MELATONIN PO Take by mouth at bedtime as needed.    [provider]  predniSONE (DELTASONE) 20 MG tablet 3 tabs po day one, then 2 tabs daily x 4 days 05/01/19   Fayrene Helper, PA-C  terconazole (TERAZOL 3) 0.8 % vaginal cream Place 1 applicator vaginally at bedtime for 3 days. 12/08/19 12/11/19  Kally Cadden, Veryl Speak, PA-C  VITAMIN E PO Take by mouth.    [provider]    Family History Family History  Problem Relation Age of Onset  . Alcohol abuse Mother        died from liver cirrhosis, at age 58    Social History Social History   Tobacco Use  . Smoking status: Never Smoker  . Smokeless tobacco:  Never Used  Substance Use Topics  . Alcohol use: No  . Drug use: No     Allergies   Toradol [ketorolac tromethamine] and Penicillins   Review of Systems Review of Systems   Physical Exam Triage Vital Signs ED Triage Vitals [12/08/19 1940]  Enc Vitals Group     BP      Pulse      Resp      Temp      Temp src      SpO2      Weight      Height      Head Circumference      Peak Flow      Pain Score 10     Pain Loc      Pain Edu?      Excl. in GC?    No data found.  Updated Vital Signs Pulse 78   Temp 98.6 F (37 C) (Oral)   SpO2 96%   Patient refused electronic blood pressure management, manual blood pressure cuff in clinic nonfunctional.  Patient states she is on blood pressure medicine and will check her blood pressure at home.  Visual Acuity Right Eye Distance:   Left Eye  Distance:   Bilateral Distance:    Right Eye Near:   Left Eye Near:    Bilateral Near:     Physical Exam Vitals and nursing note reviewed.  Constitutional:      General: She is not in acute distress.    Appearance: Normal appearance. She is well-developed. She is not ill-appearing.  HENT:     Head: Normocephalic and atraumatic.     Nose:     Comments: Turbinates are swollen and edematous bilaterally, unable to visualize past.  There is clear nasal discharge.  Significant maxillary tenderness on the right side.    Mouth/Throat:     Comments: Mild postnasal drip in the oropharynx.  No erythema or swelling. Eyes:     Conjunctiva/sclera: Conjunctivae normal.  Cardiovascular:     Rate and Rhythm: Normal rate and regular rhythm.     Heart sounds: No murmur heard.   Pulmonary:     Effort: Pulmonary effort is normal. No respiratory distress.     Breath sounds: Normal breath sounds.  Musculoskeletal:     Cervical back: Neck supple.  Skin:    General: Skin is warm and dry.  Neurological:     Mental Status: She is alert.      UC Treatments / Results  Labs (all labs ordered are listed, but only abnormal results are displayed) Labs Reviewed - No data to display  EKG   Radiology No results found.  Procedures Procedures (including critical care time)  Medications Ordered in UC Medications - No data to display  Initial Impression / Assessment and Plan / UC Course  I have reviewed the triage vital signs and the nursing notes.  Pertinent labs & imaging results that were available during my care of the patient were reviewed by me and considered in my medical decision making (see chart for details).     #Sinusitis Patient is a 69 year old presenting with acute sinusitis.  Discussed with patient that considering we are at day 7 and she is worsening would not be unreasonable to start antibiotic, I do recommend that she try Flonase, continue Zyrtec and use nasal saline for 48  more hours and then start the doxycycline.  Given penicillin allergy, doxycycline chosen.  Patient does report that she typically gets a  yeast infection after antibiotic use, patient prefers Terazol cream.  Patient is on a primary care in the area resource given.  Encouraged her to establish.  Patient agrees to the discussed plan and verbalized understanding. Final Clinical Impressions(s) / UC Diagnoses   Final diagnoses:  Acute maxillary sinusitis, recurrence not specified     Discharge Instructions     Use flonase daily as discussed, 2 times for 1 week, then daily until symptoms improve Take doxycycline if symptoms have not improved in 2 days - take for 10 days as prescribed Continue zyrtec Consider nasal saline  Use terazol if needed  Establish with a primary care provider in the area, I have given a good option  If not improving in 1 week return      ED Prescriptions    Medication Sig Dispense Auth. Provider   doxycycline (VIBRAMYCIN) 100 MG capsule Take 1 capsule (100 mg total) by mouth 2 (two) times daily. 20 capsule Montavis Schubring, Veryl Speak, PA-C   fluticasone (FLONASE) 50 MCG/ACT nasal spray Place 1 spray into both nostrils daily. 15.8 mL Lynette Topete, Veryl Speak, PA-C   terconazole (TERAZOL 3) 0.8 % vaginal cream Place 1 applicator vaginally at bedtime for 3 days. 20 g Theodus Ran, Veryl Speak, PA-C     PDMP not reviewed this encounter.   Hermelinda Medicus, PA-C 12/08/19 2049

## 2019-12-08 NOTE — Discharge Instructions (Addendum)
Use flonase daily as discussed, 2 times for 1 week, then daily until symptoms improve Take doxycycline if symptoms have not improved in 2 days - take for 10 days as prescribed Continue zyrtec Consider nasal saline  Use terazol if needed  Establish with a primary care provider in the area, I have given a good option  If not improving in 1 week return

## 2020-05-01 ENCOUNTER — Other Ambulatory Visit: Payer: Self-pay

## 2020-05-14 IMAGING — DX DG KNEE COMPLETE 4+V*L*
4 series · 4 of 4 positions shown · non-contrast
Comparison: None.

CLINICAL DATA: Pain and swelling after PRP injection.

EXAM:
LEFT KNEE - COMPLETE 4+ VIEW

[knee ap]
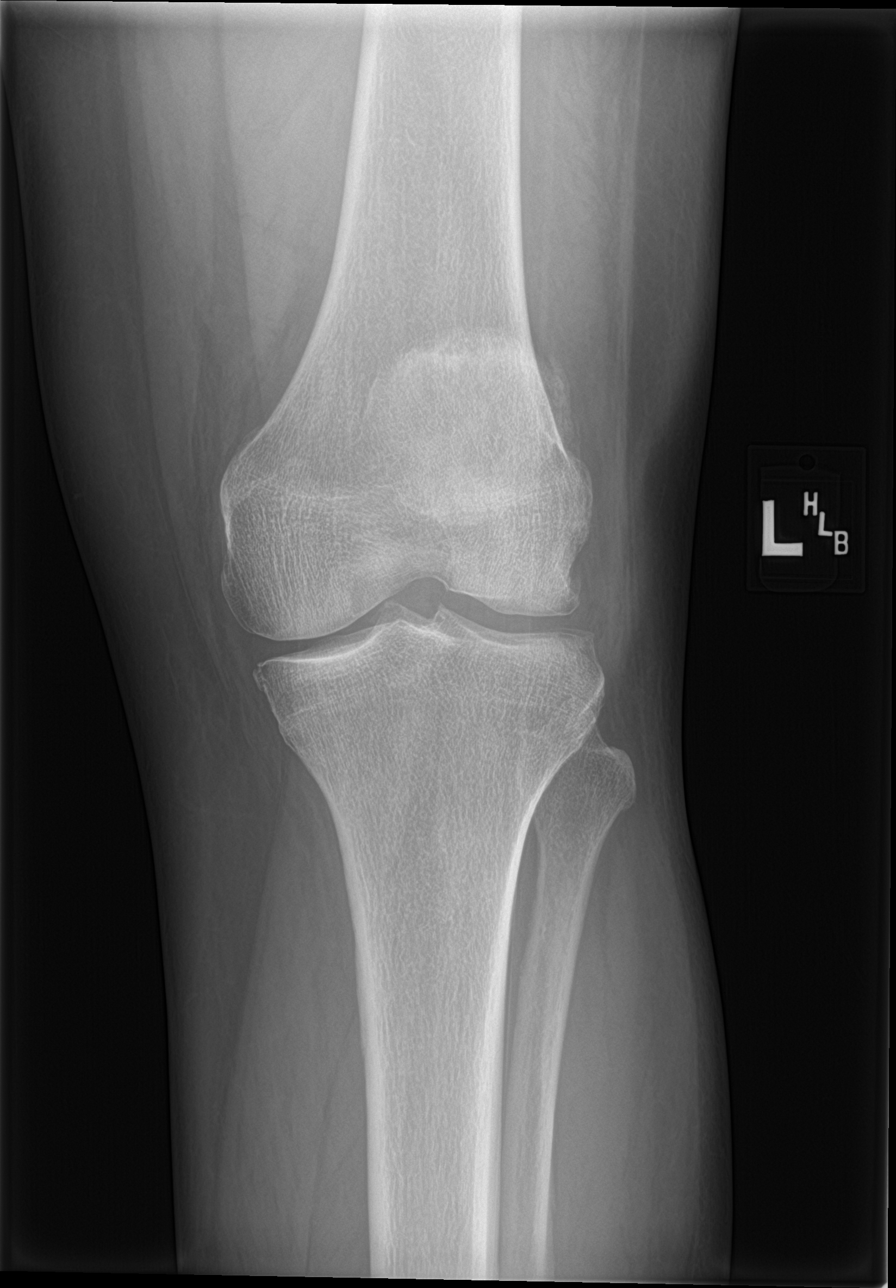

[knee lat]
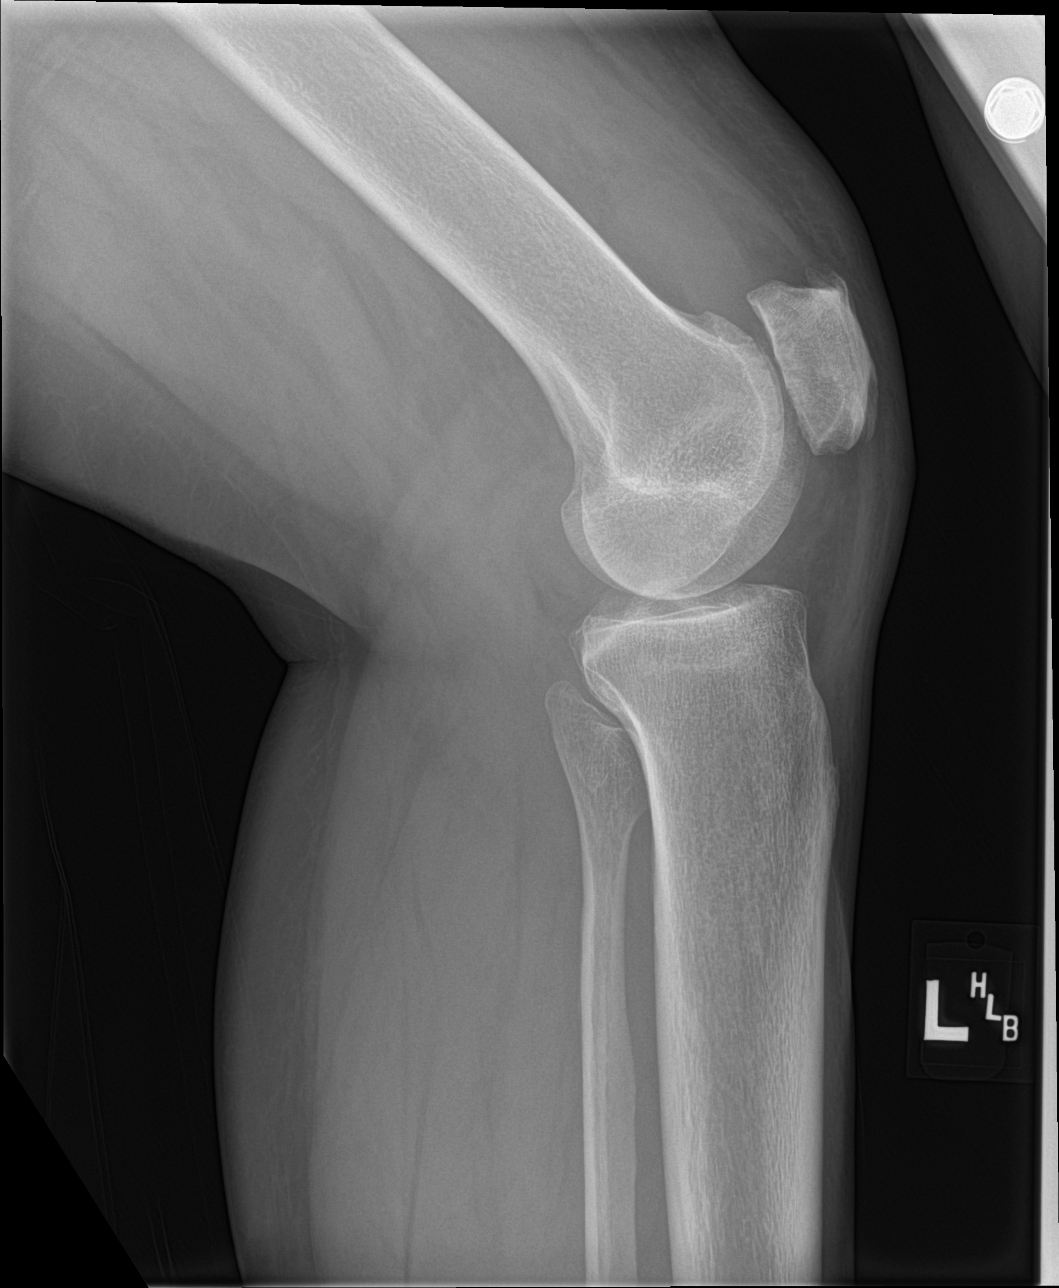

[knee obl (1 of 2)]
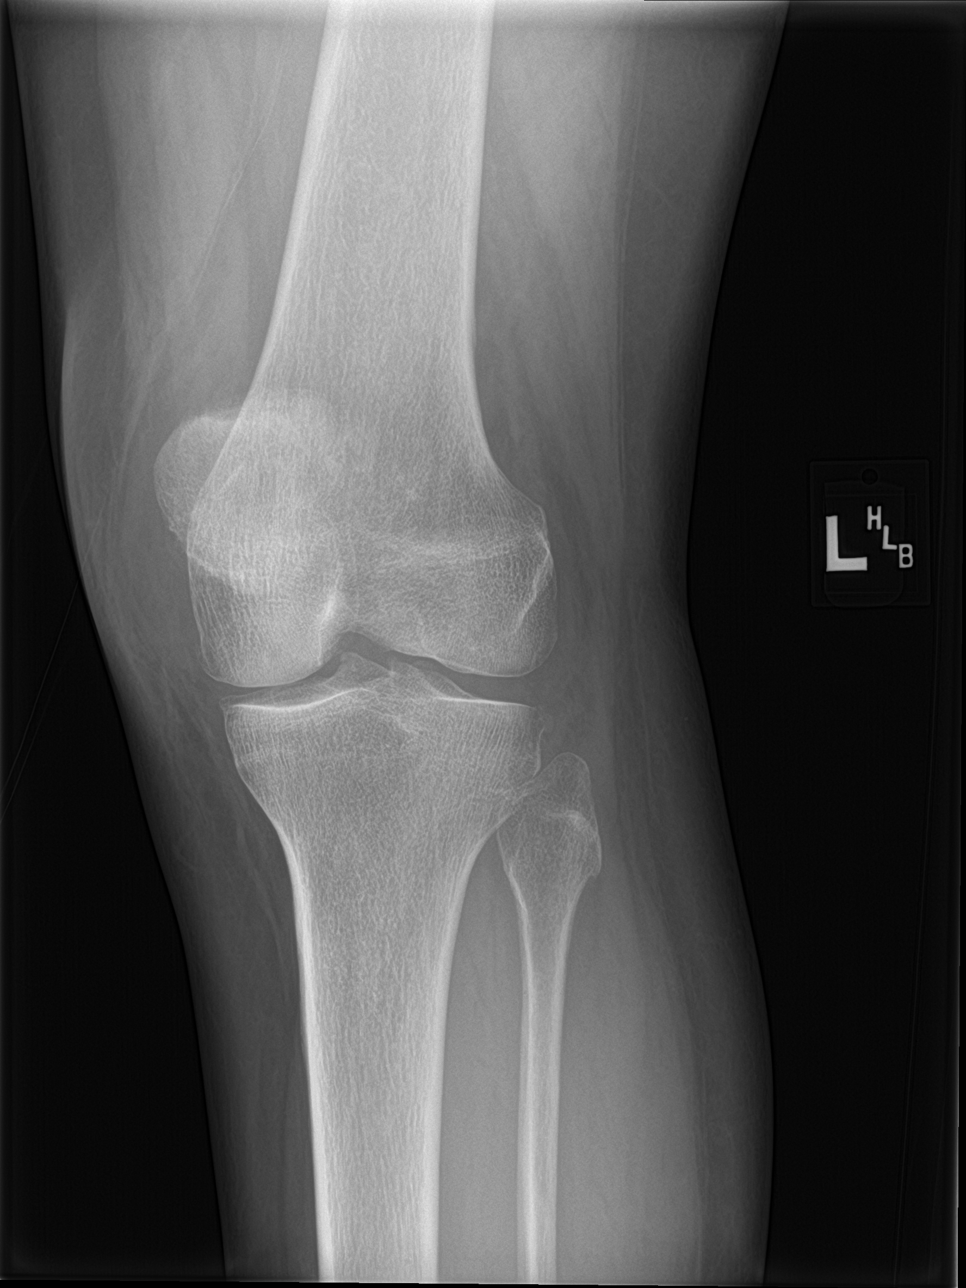

[knee obl (2 of 2)]
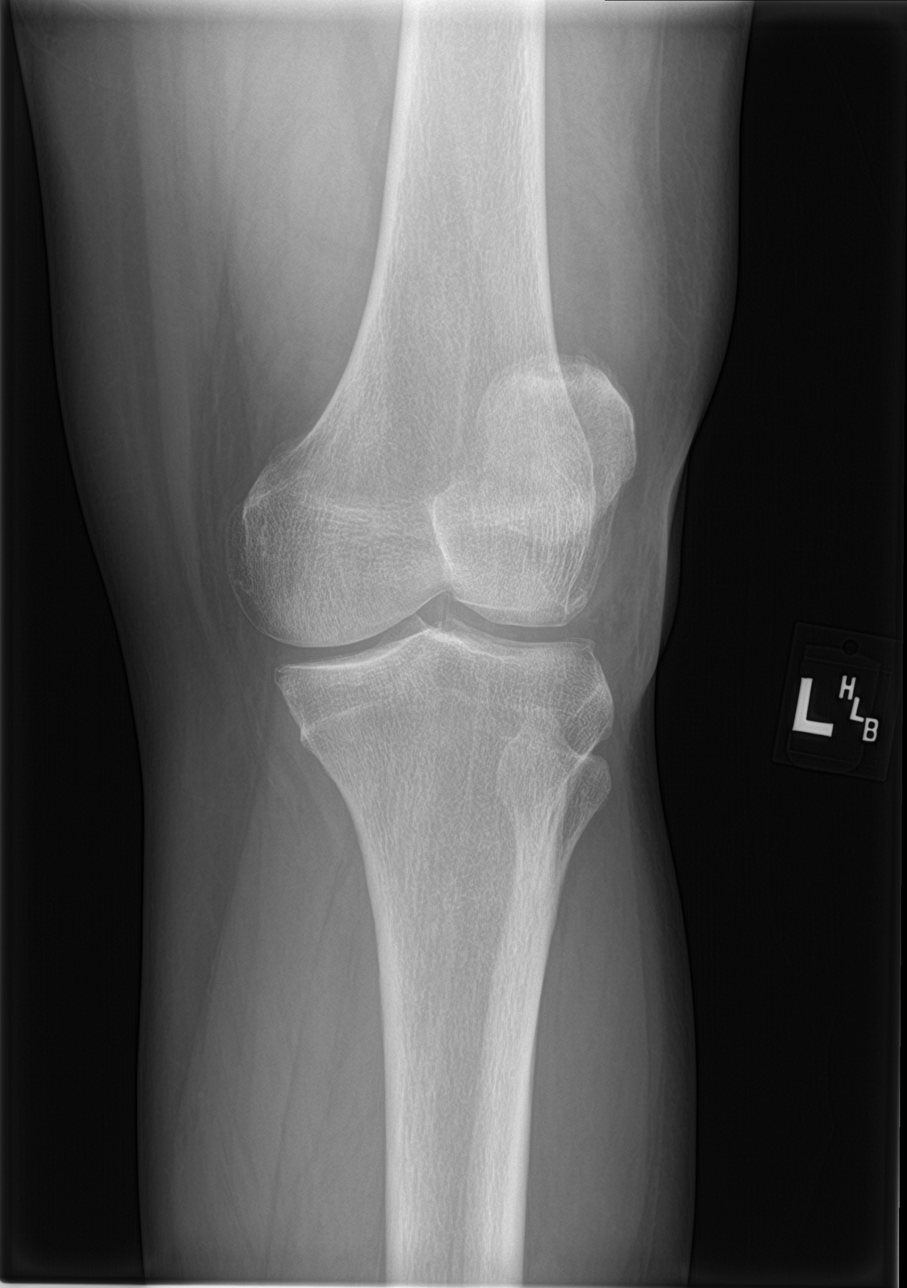

[4 of 4 positions shown; findings below may reference images not displayed]

FINDINGS: Moderate to large knee joint effusion visible on the lateral view.
Osteoarthritis of the patellofemoral joint is present.
Weight-bearing compartments show normal height without osteophytes
or irregularity. No other focal bone finding.
IMPRESSION: Moderate to large joint effusion. Osteoarthritis of the
patellofemoral joint.

## 2023-09-08 ENCOUNTER — Telehealth: Payer: Self-pay | Admitting: Physical Medicine and Rehabilitation

## 2023-09-08 NOTE — Telephone Encounter (Signed)
 Patient called and said that she had the gel injection on her knees and she said that she has fluid coming out of them where the injection site is. CB#[phone]

## 2023-09-16 ENCOUNTER — Ambulatory Visit: Admitting: Physician Assistant

## 2023-09-22 ENCOUNTER — Encounter: Payer: Self-pay | Admitting: Physician Assistant

## 2023-09-22 ENCOUNTER — Other Ambulatory Visit: Payer: Self-pay

## 2023-09-22 ENCOUNTER — Telehealth: Payer: Self-pay

## 2023-09-22 ENCOUNTER — Ambulatory Visit (INDEPENDENT_AMBULATORY_CARE_PROVIDER_SITE_OTHER): Admitting: Physician Assistant

## 2023-09-22 DIAGNOSIS — M542 Cervicalgia: Secondary | ICD-10-CM | POA: Diagnosis not present

## 2023-09-22 DIAGNOSIS — M170 Bilateral primary osteoarthritis of knee: Secondary | ICD-10-CM | POA: Diagnosis not present

## 2023-09-22 NOTE — Progress Notes (Signed)
 Office Visit Note   Patient: Sharon Mack           Date of Birth: November 20, 1950           MRN: [id number] Visit Date: 09/22/2023              Requested by: No referring provider defined for this encounter. PCP: Pcp, No   Assessment & Plan: Visit Diagnoses:  1. Bilateral primary osteoarthritis of knee   2. Cervicalgia     Plan: Impression is bilateral knee osteoarthritis and chronic neck pain.  In regards to her knees, it sounds like she underwent viscosupplementation injections/PRP injections just 3 weeks ago and is scheduled to undergo a new type of injection next week.  She would like to go ahead and get approval for repeat viscosupplementation injection for September.  He will submit this to April but I have also discussed with the patient that it would be best if she call about a month in advance to remind us  as this is so far out.  In regards to her neck.  I sent in a referral for outpatient physical therapy.  Follow-up as needed.  Follow-Up Instructions: Return if symptoms worsen or fail to improve.   Orders:  Orders Placed This Encounter  Procedures   Ambulatory referral to Physical Therapy   No orders of the defined types were placed in this encounter.     Procedures: No procedures performed   Clinical Data: No additional findings.   Subjective: Chief Complaint  Patient presents with   Right Knee - Pain   Left Knee - Pain    HPI patient is a 73 year old female who comes in today with bilateral knee discomfort as well as chronic neck and shoulder pain.  In regards to the knees, she notes intermittent discomfort worse with twisting and stair climbing.  She denies any locking or catching.  She does tell me she had a fusion several years ago and then again a couple weeks ago after undergoing PRP injection.  She notes that she has been getting Hyalgan injections which have kept her pain at bay.  She tries to only use the natural remedies for pain and  therefore does not take any NSAIDs or Tylenol .  She has been wearing a compression sleeve to help with the swelling.  In regards to the neck, she has had discomfort radiating into her shoulders for a while.  She has been to physical therapy which has significantly helped.  She would like to restart this.  Review of Systems as detailed in HPI.  All others reviewed and are negative.   Objective: Vital Signs: There were no vitals taken for this visit.  Physical Exam well-developed well-nourished female in no acute distress.  Alert and oriented x 3.  Ortho Exam bilateral knee exam: Small effusion.  Range of motion 0 to 115 degrees.  No joint line tenderness.  Mild to moderate patellofemoral crepitus.  Ligaments are stable.  She is neurovascularly intact distally.  Specialty Comments:  No specialty comments available.  Imaging: No new imaging   PMFS History: Patient Active Problem List   Diagnosis Date Noted   Elevated blood pressure 01/05/2016   Weight gain 01/05/2016   Past Medical History:  Diagnosis Date   Hypertension     Family History  Problem Relation Age of Onset   Alcohol abuse Mother        died from liver cirrhosis, at age 31    Past Surgical History:  Procedure  Laterality Date   KNEE ARTHROSCOPY WITH MENISCAL REPAIR  2014   Sub Urethral Cyst     URETHRAL CYST REMOVAL     Social History   Occupational History   Not on file  Tobacco Use   Smoking status: Never   Smokeless tobacco: Never  Substance and Sexual Activity   Alcohol use: No   Drug use: No   Sexual activity: Not on file

## 2023-09-22 NOTE — Telephone Encounter (Signed)
 Please precert for bilateral visco. Patient just had in March with another physician. Schedule with Dr.Xu once approved.

## 2023-09-29 NOTE — Telephone Encounter (Signed)
 Next available gel injection would need to be after 02/01/2024 Will submit in August, 2025 for bilateral knee

## 2023-10-08 ENCOUNTER — Encounter: Payer: Self-pay | Admitting: Rehabilitative and Restorative Service Providers"

## 2023-10-08 ENCOUNTER — Ambulatory Visit: Admitting: Rehabilitative and Restorative Service Providers"

## 2023-10-08 DIAGNOSIS — M542 Cervicalgia: Secondary | ICD-10-CM | POA: Diagnosis not present

## 2023-10-08 DIAGNOSIS — R293 Abnormal posture: Secondary | ICD-10-CM

## 2023-10-08 NOTE — Therapy (Signed)
 OUTPATIENT PHYSICAL THERAPY CERVICAL EVALUATION   Patient Name: Sharon Mack MRN: [id number] DOB:09-Aug-1950, 73 y.o., female Today's Date: 10/08/2023  END OF SESSION:  PT End of Session - 10/08/23 1530     Visit Number 1    Number of Visits 16    Date for PT Re-Evaluation 12/03/23    Authorization Type MEDICARE & BCBS    Progress Note Due on Visit 10    PT Start Time 0933    PT Stop Time 1017    PT Time Calculation (min) 44 min    Activity Tolerance Patient tolerated treatment well;No increased pain    Behavior During Therapy WFL for tasks assessed/performed             Past Medical History:  Diagnosis Date   Hypertension    Past Surgical History:  Procedure Laterality Date   KNEE ARTHROSCOPY WITH MENISCAL REPAIR  2014   Sub Urethral Cyst     URETHRAL CYST REMOVAL     Patient Active Problem List   Diagnosis Date Noted   Elevated blood pressure 01/05/2016   Weight gain 01/05/2016    PCP: NA  REFERRING PROVIDER: Sandie Cross PA-C  REFERRING DIAG: M54.2 (ICD-10-CM) - Cervicalgia  THERAPY DIAG:  Abnormal posture - Plan: PT plan of care cert/re-cert  Cervicalgia - Plan: PT plan of care cert/re-cert  Rationale for Evaluation and Treatment: Rehabilitation  ONSET DATE: Chronic  SUBJECTIVE:                                                                                                                                                                                                         SUBJECTIVE STATEMENT: Sharon Mack has been in and out of physical therapy "since the pandemic."  Symptoms developed gradually.  She notes symptoms in the right upper trapezius and occasionally upper arm.  Hand dominance: Right  PERTINENT HISTORY:  Rt meniscus repair, HTN  PAIN:  Are you having pain? Yes: NPRS scale: 0-3/10 on the Numeric Pain rating Scale Pain location: See above Pain description: Ache Aggravating factors: Sleeping on the right Relieving factors:  PT exercises, TENS  PRECAUTIONS: Cervical  RED FLAGS: None     WEIGHT BEARING RESTRICTIONS: No  FALLS:  Has patient fallen in last 6 months? No  LIVING ENVIRONMENT: Lives with: lives alone Lives in: House/apartment Stairs: Has a previous right meniscus scope. Has following equipment at home: None  OCCUPATION: Retired NP in OB/GYN  PLOF: Independent  PATIENT GOALS: Return to roller skating, total gym, treadmill.  Better AROM, strength, less discomfort (particularly right  upper trapezius).  NEXT MD VISIT: 10/20/2023 Dr. Vaughn Georges  OBJECTIVE:  Note: Objective measures were completed at Evaluation unless otherwise noted.  DIAGNOSTIC FINDINGS:  Unavailable 10/08/2023  PATIENT SURVEYS:  Patient-Specific Activity Scoring Scheme  "0" represents "unable to perform." "10" represents "able to perform at prior level. 0 1 2 3 4 5 6 7 8 9  10 (Date and Score)   Activity Eval     1. Roller skating  0/10    2. Rt arm use  7/10    3. Checking mirrors in car 8/10   4.    5.    Score 5  AVG    Total score = sum of the activity scores/number of activities Minimum detectable change (90%CI) for average score = 2 points Minimum detectable change (90%CI) for single activity score = 3 points     COGNITION: Overall cognitive status: Within functional limits for tasks assessed  SENSATION: No peripheral paresthesias are noted  POSTURE: rounded shoulders, forward head, decreased lumbar lordosis, and flexed trunk    CERVICAL ROM:   Active ROM A/PROM (deg) eval  Flexion   Extension 65  Right lateral flexion 20  Left lateral flexion 25  Right rotation 45  Left rotation 55   (Blank rows = not tested)  UPPER EXTREMITY ROM:  Passive ROM Left/Right   Shoulder flexion    Shoulder extension    Shoulder abduction    Shoulder adduction    Shoulder extension    Shoulder internal rotation    Shoulder external rotation    Elbow flexion    Elbow extension    Wrist flexion     Wrist extension    Wrist ulnar deviation    Wrist radial deviation    Wrist pronation    Wrist supination     (Blank rows = not tested)  STRENGTH:  Assessed in pounds with hand-held dynamometer Left/Right 10/08/2023   Shoulder flexion    Shoulder extension    Shoulder abduction    Shoulder adduction    Shoulder extension    Shoulder internal rotation    Shoulder external rotation    Middle trapezius    Lower trapezius    Elbow flexion    Elbow extension    Wrist flexion    Wrist extension    Wrist ulnar deviation    Wrist radial deviation    Wrist pronation    Wrist supination    Grip strength    Cervical Extension  49.3   Cervical Lateral Bending Left 36.2   Cervical Lateral Bending Right 36.1    (Blank rows = not tested)   TREATMENT DATE: 10/08/2023 Scapular retraction 5 x 5 seconds Cervical rotation AROM with shoulders back 10 x 5 seconds           Shoulder hike/shrug with scapular retraction 10 x 5 seconds  16109: Reviewed cervical spine anatomy basics (posture, cervical lordosis), discussed sleeping postures and pillow recommendations, sitting postures, lumbar roll use, the importance of frequent changes in position, reading posture and the importance of walking 3 times a week for 20+ minutes if her knee will allow it.  Reviewed examination findings and day 1 home exercise program.  Discussed expectations and the expected timeframe for supervised physical therapy.  PATIENT EDUCATION:  Education details: See above Person educated: Patient Education method: Explanation, Demonstration, Tactile cues, Verbal cues, and Handouts Education comprehension: verbalized understanding, returned demonstration, verbal cues required, tactile cues required, and needs further education  HOME EXERCISE PROGRAM: Access Code: [email or web address] URL:  [email or web address] Date: 10/08/2023 Prepared by: Terral Ferrari  Exercises - Standing Scapular Retraction  - 5 x daily - 7 x weekly - 1 sets - 5 reps - 5 second hold - Standing Shoulder Shrugs with Dumbbells  - 5 x daily - 7 x weekly - 1 sets - 5 reps - 5 seconds hold - Cervical Rotation AROM  - 3 x daily - 7 x weekly - 1 sets - 10 reps - 5 seconds hold  ASSESSMENT:  CLINICAL IMPRESSION: Patient is a 73 y.o. female who was seen today for physical therapy evaluation and treatment for M54.2 (ICD-10-CM) - Cervicalgia.  Sharon Mack has chronic cervical spine pain that she has been dealing with for several years.  She appears to be very knowledgeable and compliant with her early physical therapy and would like some updates and tips to help her better manage her symptoms.  We also discussed possibly doing a shoulder screen on her next visit as her right upper arm pain might also have a shoulder contribution.  Postural limitations were most significant as her active range of motion only had mild impairments and her cervical strength was actually quite good.  Will focus on postural and scapular strengthening, posture and body mechanics education and a shoulder screen on her neck supervised visit.  OBJECTIVE IMPAIRMENTS: decreased activity tolerance, decreased endurance, decreased ROM, decreased strength, impaired perceived functional ability, impaired UE functional use, improper body mechanics, postural dysfunction, and pain.   ACTIVITY LIMITATIONS: carrying, lifting, sleeping, and reach over head  PARTICIPATION LIMITATIONS: driving and community activity  PERSONAL FACTORS: Rt meniscus repair, HTN are also affecting patient's functional outcome.   REHAB POTENTIAL: Good  CLINICAL DECISION MAKING: Stable/uncomplicated  EVALUATION COMPLEXITY: Moderate   GOALS: Goals reviewed with patient? Yes  SHORT TERM GOALS: Target date: 11/05/2023  Sharon Mack will be independent with her day 1 home  exercise program Baseline: Started 10/08/2023 Goal status: INITIAL  2.  Improve cervical active range of motion for lateral bending to 25 degrees and rotation to 60 degrees Baseline: 25/20 and 55/45 respectively Goal status: INITIAL  3.  Sharon Mack will have an improved postural awareness and will be able to complete this into all static and dynamic activities Baseline: See postural abnormalities noted above Goal status: INITIAL   LONG TERM GOALS: Target date: 12/03/2023  Improve patient's specific functional scale to an average of 8 Baseline: Average of 5 Goal status: INITIAL  2.  Improve cervical, right upper trapezius, scapular and right arm pain to consistently 0-2/10 on the visual analog scale Baseline: 0-3/10 Goal status: INITIAL  3.  Improve scapular and postural strength as assessed by performance with functional activities and subjective self-report Baseline: See postural abnormalities above Goal status: INITIAL  4.  Sharon Mack will be independent with her long-term maintenance home exercise program Baseline: Started 10/08/2023 Goal status: INITIAL   PLAN:  PT FREQUENCY: 1-2x/week  PT DURATION: 8 weeks  PLANNED INTERVENTIONS: 97110-Therapeutic exercises, 97530- Therapeutic activity, 97112- Neuromuscular re-education, 97535- Self Care, 28413- Manual therapy, 97012- Traction (mechanical), Patient/Family education, Dry Needling, Cryotherapy, and Moist heat  PLAN FOR NEXT SESSION: Do a shoulder screen.  Reviewed day 1 home exercise program, review her previous physical therapy exercises and spend time on posture and body mechanics  education.   Joli Neas, PT, MPT 10/08/2023, 3:52 PM

## 2023-10-20 ENCOUNTER — Encounter: Payer: Self-pay | Admitting: Sports Medicine

## 2023-10-20 ENCOUNTER — Other Ambulatory Visit (INDEPENDENT_AMBULATORY_CARE_PROVIDER_SITE_OTHER): Payer: Self-pay

## 2023-10-20 ENCOUNTER — Ambulatory Visit (INDEPENDENT_AMBULATORY_CARE_PROVIDER_SITE_OTHER): Admitting: Sports Medicine

## 2023-10-20 DIAGNOSIS — M1711 Unilateral primary osteoarthritis, right knee: Secondary | ICD-10-CM

## 2023-10-20 DIAGNOSIS — Z9889 Other specified postprocedural states: Secondary | ICD-10-CM | POA: Diagnosis not present

## 2023-10-20 DIAGNOSIS — M170 Bilateral primary osteoarthritis of knee: Secondary | ICD-10-CM

## 2023-10-20 NOTE — Progress Notes (Addendum)
 Francenia Mack - 73 y.o. female MRN [id number]  Date of birth: Feb 03, 1951  Office Visit Note: Visit Date: 10/20/2023 PCP: Pcp, No Referred by: No ref. provider found  Subjective: Chief Complaint  Patient presents with   Right Knee - Pain   Left Knee - Pain   HPI: Sharon Mack is a pleasant 73 y.o. female who presents today for chronic bilateral knee pain, she has been seen in our clinic before but this is my first evaluation.  She is a very fit and active individual.  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  She has had a history of chronic bilateral knee pain.  She did undergo prior treatment for her knees there before moving to [place] . In the past she had a history of PRP injection but had a bad inflammatory reaction which caused an effusion.  She recently underwent viscosupplementation injections for both knees back in March.  She also sees Dr. Kayla over in [place] and did receive Bio[employer]ew PTM injection therapy for the right knee on 09/28/23 -has not noticed significant effect yet, but knows this takes 2 to 3 months for maximal efficacy.  If this is beneficial, she is interested in proceeding with this for the contralateral knee as well.  Her knees are not that bothersome now, but she would like to continue maintaining a preventative and regenerative approach to her knees.  She does not take over-the-counter medications, she does use natural remedies and supplements.  She stays very active and does skate and perform physical activity on a regular basis. She also is quite active with her church.  She does have a notable history of right knee arthroscopy with meniscal repair in 2014.  Pertinent ROS were reviewed with the patient and found to be negative unless otherwise specified above in HPI.   Assessment & Plan: Visit Diagnoses:  1. Bilateral primary osteoarthritis of knee   2. History of arthroscopy of right  knee    Plan: Impression is chronic bilateral knee pain with advancing medial tibiofemoral joint space narrowing and arthritic change of bilateral knees. She is not interested in a knee replacement at any point, which I am understanding of. Sharon Mack is a very fit and active individual and both she and I would like to keep her this way as well as maintain function and joint preservation of both of her knees.  She has recently undergone Bio[employer]ew PTM injection therapy for the right knee on 09/28/23 with Dr. Kayla.  I would like her to wait until about the 38-month mark to see the efficacy, if she finds this beneficial I would support her proceeding with this for the contralateral left knee as well.  We discussed viscosupplementation for lubrication and hyaluronic acid for the knee joints, she will be due for this upcoming in September (after 02/01/24) of this year. She will let me know around this time if she wishes to proceed with this for the bilateral knees.  In the meantime, she will continue staying active with skating and other physical activity.  We will leave follow-up on an as-needed basis.  Addendum (11/30/23): We will plan to prescribe SAM 2.0 Device to wear for treatment of her bilateral osteoarthritis of the knees. Did discuss this with device rep, Aleck Shropshire. Patient is agreeable.  Follow-up: Return if symptoms worsen or fail to improve.   Meds & Orders: No orders of the defined types were placed in this encounter.   Orders Placed  This Encounter  Procedures   XR Knee 1-2 Views Right     Procedures: No procedures performed      Clinical History: No specialty comments available.  She reports that she has never smoked. She has never used smokeless tobacco. No results for input(s): HGBA1C, LABURIC in the last 8760 hours.  Objective:    Physical Exam  Gen: Well-appearing, in no acute distress; non-toxic CV: Well-perfused. Warm.  Resp: Breathing unlabored on room air; no  wheezing. Psych: Fluid speech in conversation; appropriate affect; normal thought process  Ortho Exam - Bilateral knees: There is a trace effusion on the right knee without significant warmth or redness, no effusion of the left knee.  Range of motion is relatively well-preserved with the right knee 0-120 degrees, left knee 0-125 degrees.  She does have mild to moderate bilateral patellofemoral crepitus.  There is no varus or valgus instability.  Imaging: XR Knee 1-2 Views Right Result Date: 10/20/2023 2 view bilateral knee including standing AP and Cecillia view were ordered and reviewed by myself today.  X-rays demonstrate mild tricompartmental arthritic change with moderate to severe narrowing over bilateral medial tibiofemoral joint spaces.  There is osteophytosis and spurring off the right knee medial femoral condyle and medial proximal tibia.  Cecillia view shows left medial joint space significantly narrowed.   XR Knee 1-2 Views Right 2 view bilateral knee including standing AP and Cecillia view were  ordered and reviewed by myself today.  X-rays demonstrate mild  tricompartmental arthritic change with moderate to severe narrowing over  bilateral medial tibiofemoral joint spaces.  There is osteophytosis and  spurring off the right knee medial femoral condyle and medial proximal  tibia.  Cecillia view shows left medial joint space significantly  narrowed.    Past Medical/Family/Surgical/Social History: Medications & Allergies reviewed per EMR, new medications updated. Patient Active Problem List   Diagnosis Date Noted   Elevated blood pressure 01/05/2016   Weight gain 01/05/2016   Past Medical History:  Diagnosis Date   Hypertension    Family History  Problem Relation Age of Onset   Alcohol abuse Mother        died from liver cirrhosis, at age 66   Past Surgical History:  Procedure Laterality Date   KNEE ARTHROSCOPY WITH MENISCAL REPAIR  2014   Sub Urethral Cyst      URETHRAL CYST REMOVAL     Social History   Occupational History   Not on file  Tobacco Use   Smoking status: Never   Smokeless tobacco: Never  Substance and Sexual Activity   Alcohol use: No   Drug use: No   Sexual activity: Not on file   I spent 38 minutes in the care of the patient today including face-to-face time, preparation to see the patient, as well as review of previous knee x-ray imaging, review and interpretation of updated x-rays today, discussion on regenerative and Ortho biologic treatments and preservation of bilateral knee joints, physical exercises for the above diagnoses.   Lonell Sprang, DO Primary Care Sports Medicine Physician  [facility]  This note was dictated using Dragon naturally speaking software and may contain errors in syntax, spelling, or content which have not been identified prior to signing this note.

## 2023-10-20 NOTE — Progress Notes (Signed)
 Patient says that she would like to discuss other treatment options for her knees. She says that she had a PRP injection a couple of years ago, but had a bad inflammatory reaction to it. She says that she had gel injections in March. She does not take medication as she prefers natural treatments. She says that she does not have much pain now, but would like to stay ahead of it by exploring other treatment options.

## 2023-10-29 ENCOUNTER — Encounter: Payer: Self-pay | Admitting: Rehabilitative and Restorative Service Providers"

## 2023-10-29 ENCOUNTER — Ambulatory Visit: Admitting: Rehabilitative and Restorative Service Providers"

## 2023-10-29 ENCOUNTER — Encounter: Admitting: Rehabilitative and Restorative Service Providers"

## 2023-10-29 DIAGNOSIS — R293 Abnormal posture: Secondary | ICD-10-CM

## 2023-10-29 DIAGNOSIS — M542 Cervicalgia: Secondary | ICD-10-CM | POA: Diagnosis not present

## 2023-10-29 NOTE — Therapy (Signed)
 OUTPATIENT PHYSICAL THERAPY CERVICAL TREATMENT   Patient Name: Sharon Mack MRN: [id number] DOB:04-12-1951, 73 y.o., female Today's Date: 10/29/2023  END OF SESSION:  PT End of Session - 10/29/23 1330     Visit Number 2    Number of Visits 16    Date for PT Re-Evaluation 12/03/23    Authorization Type MEDICARE & BCBS    Progress Note Due on Visit 10    PT Start Time [phone]    PT Stop Time 1019    PT Time Calculation (min) 43 min    Activity Tolerance Patient tolerated treatment well;No increased pain    Behavior During Therapy WFL for tasks assessed/performed              Past Medical History:  Diagnosis Date   Hypertension    Past Surgical History:  Procedure Laterality Date   KNEE ARTHROSCOPY WITH MENISCAL REPAIR  2014   Sub Urethral Cyst     URETHRAL CYST REMOVAL     Patient Active Problem List   Diagnosis Date Noted   Elevated blood pressure 01/05/2016   Weight gain 01/05/2016    PCP: NA  REFERRING PROVIDER: Sandie Cross PA-C  REFERRING DIAG: M54.2 (ICD-10-CM) - Cervicalgia  THERAPY DIAG:  Abnormal posture  Cervicalgia  Rationale for Evaluation and Treatment: Rehabilitation  ONSET DATE: Chronic  SUBJECTIVE:                                                                                                                                                                                                         SUBJECTIVE STATEMENT: Amaiya reports good early home exercise program compliance.  She still has some difficulty with reaching with her right arm and we discussed completing her shoulder screen today.  Raetta has been in and out of physical therapy "since the pandemic."  Symptoms developed gradually.  She notes symptoms in the right upper trapezius and occasionally upper arm.  Hand dominance: Right  PERTINENT HISTORY:  Rt meniscus repair, HTN  PAIN:  Are you having pain? Yes: NPRS scale: 0/10 on the Numeric Pain rating Scale Pain  location: See above Pain description: Ache Aggravating factors: Sleeping on the right Relieving factors: PT exercises, TENS  PRECAUTIONS: Cervical  RED FLAGS: None     WEIGHT BEARING RESTRICTIONS: No  FALLS:  Has patient fallen in last 6 months? No  LIVING ENVIRONMENT: Lives with: lives alone Lives in: House/apartment Stairs: Has a previous right meniscus scope. Has following equipment at home: None  OCCUPATION: Retired NP in OB/GYN  PLOF: Independent  PATIENT  GOALS: Return to roller skating, total gym, treadmill.  Better AROM, strength, less discomfort (particularly right upper trapezius).  NEXT MD VISIT: 10/20/2023 Dr. Vaughn Georges  OBJECTIVE:  Note: Objective measures were completed at Evaluation unless otherwise noted.  DIAGNOSTIC FINDINGS:  Unavailable 10/08/2023  PATIENT SURVEYS:  Patient-Specific Activity Scoring Scheme  "0" represents "unable to perform." "10" represents "able to perform at prior level. 0 1 2 3 4 5 6 7 8 9  10 (Date and Score)   Activity Eval     1. Roller skating  0/10    2. Rt arm use  7/10    3. Checking mirrors in car 8/10   4.    5.    Score 5  AVG    Total score = sum of the activity scores/number of activities Minimum detectable change (90%CI) for average score = 2 points Minimum detectable change (90%CI) for single activity score = 3 points     COGNITION: Overall cognitive status: Within functional limits for tasks assessed  SENSATION: No peripheral paresthesias are noted  POSTURE: rounded shoulders, forward head, decreased lumbar lordosis, and flexed trunk    CERVICAL ROM:   Active ROM A/PROM (deg) eval AROM (deg) 10/29/2023  Flexion    Extension 65 70  Right lateral flexion 20 30  Left lateral flexion 25 30  Right rotation 45 60  Left rotation 55 60   (Blank rows = not tested)  UPPER EXTREMITY ROM:  Passive ROM Left/Right 10/29/2023   Shoulder flexion 175/170   Shoulder extension    Shoulder abduction     Shoulder horizontal adduction 45/45   Shoulder extension    Shoulder internal rotation 90/75   Shoulder external rotation 90/95   Elbow flexion    Elbow extension    Wrist flexion    Wrist extension    Wrist ulnar deviation    Wrist radial deviation    Wrist pronation    Wrist supination     (Blank rows = not tested)  STRENGTH:  Assessed in pounds with hand-held dynamometer Left/Right 10/08/2023 Left/Right 10/29/2023  Shoulder flexion    Shoulder extension    Shoulder abduction    Shoulder adduction    Shoulder extension    Shoulder internal rotation 29.7/27.9 29.7/27.9  Shoulder external rotation 23.3/13.9 23.3/13.9  Middle trapezius    Lower trapezius    Elbow flexion    Elbow extension    Wrist flexion    Wrist extension    Wrist ulnar deviation    Wrist radial deviation    Wrist pronation    Wrist supination    Grip strength    Cervical Extension  49.3   Cervical Lateral Bending Left 36.2   Cervical Lateral Bending Right 36.1    (Blank rows = not tested)   TREATMENT DATE:  10/29/2023 Scapular retraction 5 x 5 seconds Cervical rotation AROM with shoulders back 10 x 5 seconds           Shoulder hike/shrug with scapular retraction 10 x 5 seconds Prone 90 & 100 degrees 5 x each  Thera-Band Red ER 10 x 3 seconds  Functional Activities: Shoulder screen which shows significant posterior rotator cuff weakness on the right side.  Reviewed and updated her home exercise program incorporating findings from her shoulder screen today.   10/08/2023 Scapular retraction 5 x 5 seconds Cervical rotation AROM with shoulders back 10 x 5 seconds           Shoulder hike/shrug with scapular retraction 10 x  5 seconds  16109: Reviewed cervical spine anatomy basics (posture, cervical lordosis), discussed sleeping postures and pillow recommendations, sitting postures, lumbar roll use, the importance of frequent changes in position, reading posture and the importance of walking 3 times  a week for 20+ minutes if her knee will allow it.  Reviewed examination findings and day 1 home exercise program.  Discussed expectations and the expected timeframe for supervised physical therapy.                                                                                                                    PATIENT EDUCATION:  Education details: See above Person educated: Patient Education method: Explanation, Demonstration, Tactile cues, Verbal cues, and Handouts Education comprehension: verbalized understanding, returned demonstration, verbal cues required, tactile cues required, and needs further education  HOME EXERCISE PROGRAM: Access Code: [email or web address] URL: [email or web address] Date: 10/29/2023 Prepared by: Terral Ferrari  Exercises - Standing Scapular Retraction  - 5 x daily - 7 x weekly - 1 sets - 5 reps - 5 second hold - Standing Shoulder Shrugs with Dumbbells  - 5 x daily - 7 x weekly - 1 sets - 5 reps - 5 seconds hold - Seated Cervical Rotation AROM  - 1 x daily - 7 x weekly - 1 sets - 10 reps - 5 seconds hold - Prone Shoulder Horizontal Abduction with Thumbs Up  - 1 x daily - 7 x weekly - 1-2 sets - 5-10 reps - 3-5 seconds hold - Shoulder External Rotation with Anchored Resistance  - 1 x daily - 7 x weekly - 2 sets - 10 reps - 3 second hold  ASSESSMENT:  CLINICAL IMPRESSION: Ladawna reports and demonstrates good understanding of her day 1 home exercise program.  Objectively, she is moving much better with her cervical spine and we were able to complete her shoulder screen which shows significant right shoulder posterior rotator cuff weakness.  We started Shianna on some activities to address this and will need to progress her scapular strengthening as well on her next visit.  Patient is a 73 y.o. female who was seen today for physical therapy evaluation and treatment for M54.2 (ICD-10-CM) - Cervicalgia.  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  She appears to be very knowledgeable and compliant with her early physical therapy and would like some updates and tips to help her better manage her symptoms.  We also discussed possibly doing a shoulder screen on her next visit as her right upper arm pain might also have a shoulder contribution.  Postural limitations were most significant as her active range of motion only had mild impairments and her cervical strength was actually quite good.  Will focus on postural and scapular strengthening, posture and body mechanics education and a shoulder screen on her neck supervised visit.  OBJECTIVE IMPAIRMENTS: decreased activity tolerance, decreased endurance, decreased ROM, decreased strength, impaired perceived functional ability, impaired UE functional use, improper body mechanics, postural dysfunction, and  pain.   ACTIVITY LIMITATIONS: carrying, lifting, sleeping, and reach over head  PARTICIPATION LIMITATIONS: driving and community activity  PERSONAL FACTORS: Rt meniscus repair, HTN are also affecting patient's functional outcome.   REHAB POTENTIAL: Good  CLINICAL DECISION MAKING: Stable/uncomplicated  EVALUATION COMPLEXITY: Moderate   GOALS: Goals reviewed with patient? Yes  SHORT TERM GOALS: Target date: 11/05/2023  Idonna will be independent with her day 1 home exercise program Baseline: Started 10/08/2023 Goal status: Met 10/29/2023  2.  Improve cervical active range of motion for lateral bending to 25 degrees and rotation to 60 degrees Baseline: 25/20 and 55/45 respectively Goal status: Met 10/29/2023  3.  Caralee will have an improved postural awareness and will be able to complete this into all static and dynamic activities Baseline: See postural abnormalities noted above Goal status: Ongoing 10/29/2023   LONG TERM GOALS: Target date: 12/03/2023  Improve patient's specific functional scale to an average of 8 Baseline: Average of 5 Goal status:  INITIAL  2.  Improve cervical, right upper trapezius, scapular and right arm pain to consistently 0-2/10 on the visual analog scale Baseline: 0-3/10 Goal status: INITIAL  3.  Improve scapular and postural strength as assessed by performance with functional activities and subjective self-report Baseline: See postural abnormalities above Goal status: INITIAL  4.  Koby will be independent with her long-term maintenance home exercise program Baseline: Started 10/08/2023 Goal status: INITIAL   PLAN:  PT FREQUENCY: 1-2x/week  PT DURATION: 8 weeks  PLANNED INTERVENTIONS: 97110-Therapeutic exercises, 97530- Therapeutic activity, 97112- Neuromuscular re-education, 97535- Self Care, 62831- Manual therapy, 97012- Traction (mechanical), Patient/Family education, Dry Needling, Cryotherapy, and Moist heat  PLAN FOR NEXT SESSION: Review her current home exercise program and make appropriate progressions for scapular and postural strength.  If time allows, review her previous physical therapy exercises and spend time on posture and body mechanics education.   Joli Neas, PT, MPT 10/29/2023, 1:36 PM

## 2023-11-04 ENCOUNTER — Ambulatory Visit (INDEPENDENT_AMBULATORY_CARE_PROVIDER_SITE_OTHER): Admitting: Rehabilitative and Restorative Service Providers"

## 2023-11-04 ENCOUNTER — Encounter: Payer: Self-pay | Admitting: Rehabilitative and Restorative Service Providers"

## 2023-11-04 DIAGNOSIS — R293 Abnormal posture: Secondary | ICD-10-CM

## 2023-11-04 DIAGNOSIS — M542 Cervicalgia: Secondary | ICD-10-CM

## 2023-11-04 NOTE — Therapy (Signed)
 OUTPATIENT PHYSICAL THERAPY CERVICAL TREATMENT   Patient Name: Sharon Mack MRN: [id number] DOB:1950/05/30, 73 y.o., female Today's Date: 11/04/2023  END OF SESSION:  PT End of Session - 11/04/23 1456     Visit Number 3    Number of Visits 16    Date for PT Re-Evaluation 12/03/23    Authorization Type MEDICARE & BCBS    Progress Note Due on Visit 10    PT Start Time 1101    PT Stop Time 1144    PT Time Calculation (min) 43 min    Activity Tolerance Patient tolerated treatment well;No increased pain    Behavior During Therapy WFL for tasks assessed/performed            Past Medical History:  Diagnosis Date   Hypertension    Past Surgical History:  Procedure Laterality Date   KNEE ARTHROSCOPY WITH MENISCAL REPAIR  2014   Sub Urethral Cyst     URETHRAL CYST REMOVAL     Patient Active Problem List   Diagnosis Date Noted   Elevated blood pressure 01/05/2016   Weight gain 01/05/2016    PCP: NA  REFERRING PROVIDER: Sandie Cross PA-C  REFERRING DIAG: M54.2 (ICD-10-CM) - Cervicalgia  THERAPY DIAG:  Abnormal posture  Cervicalgia  Rationale for Evaluation and Treatment: Rehabilitation  ONSET DATE: Chronic  SUBJECTIVE:                                                                                                                                                                                                         SUBJECTIVE STATEMENT: Reniyah reports continued home exercise program compliance.  She still has difficulty sleeping on her right side and with reaching with her right arm.  Ankita has been in and out of physical therapy since the pandemic.  Symptoms developed gradually.  She notes symptoms in the right upper trapezius and occasionally upper arm.  Hand dominance: Right  PERTINENT HISTORY:  Rt meniscus repair, HTN  PAIN:  Are you having pain? Yes: NPRS scale: 0-2/10 Rt shoulder > neck this week on the Numeric Pain Rating Scale Pain  location: See above Pain description: Ache Aggravating factors: Sleeping on the right Relieving factors: PT exercises, TENS  PRECAUTIONS: Cervical  RED FLAGS: None     WEIGHT BEARING RESTRICTIONS: No  FALLS:  Has patient fallen in last 6 months? No  LIVING ENVIRONMENT: Lives with: lives alone Lives in: House/apartment Stairs: Has a previous right meniscus scope. Has following equipment at home: None  OCCUPATION: Retired NP in OB/GYN  PLOF: Independent  PATIENT  GOALS: Return to roller skating, total gym, treadmill.  Better AROM, strength, less discomfort (particularly right upper trapezius).  NEXT MD VISIT: 10/20/2023 Dr. Vaughn Georges  OBJECTIVE:  Note: Objective measures were completed at Evaluation unless otherwise noted.  DIAGNOSTIC FINDINGS:  Unavailable 10/08/2023  PATIENT SURVEYS:  Patient-Specific Activity Scoring Scheme  0 represents "unable to perform." 10 represents "able to perform at prior level. 0 1 2 3 4 5 6 7 8 9  10 (Date and Score)   Activity Eval     1. Roller skating  0/10    2. Rt arm use  7/10    3. Checking mirrors in car 8/10   4.    5.    Score 5  AVG    Total score = sum of the activity scores/number of activities Minimum detectable change (90%CI) for average score = 2 points Minimum detectable change (90%CI) for single activity score = 3 points     COGNITION: Overall cognitive status: Within functional limits for tasks assessed  SENSATION: No peripheral paresthesias are noted  POSTURE: rounded shoulders, forward head, decreased lumbar lordosis, and flexed trunk    CERVICAL ROM:   Active ROM A/PROM (deg) eval AROM (deg) 10/29/2023  Flexion    Extension 65 70  Right lateral flexion 20 30  Left lateral flexion 25 30  Right rotation 45 60  Left rotation 55 60   (Blank rows = not tested)  UPPER EXTREMITY ROM:  Passive ROM Left/Right 10/29/2023   Shoulder flexion 175/170   Shoulder extension    Shoulder abduction     Shoulder horizontal adduction 45/45   Shoulder extension    Shoulder internal rotation 90/75   Shoulder external rotation 90/95   Elbow flexion    Elbow extension    Wrist flexion    Wrist extension    Wrist ulnar deviation    Wrist radial deviation    Wrist pronation    Wrist supination     (Blank rows = not tested)  STRENGTH:  Assessed in pounds with hand-held dynamometer Left/Right 10/08/2023 Left/Right 10/29/2023 Right  11/04/2023  Shoulder flexion     Shoulder extension     Shoulder abduction     Shoulder adduction     Shoulder extension     Shoulder internal rotation  29.7/27.9 27.8  Shoulder external rotation  23.3/13.9 12.8  Middle trapezius     Lower trapezius     Elbow flexion     Elbow extension     Wrist flexion     Wrist extension     Wrist ulnar deviation     Wrist radial deviation     Wrist pronation     Wrist supination     Grip strength     Cervical Extension  49.3    Cervical Lateral Bending Left 36.2    Cervical Lateral Bending Right 36.1     (Blank rows = not tested)   TREATMENT DATE:  11/04/2023 Scapular retraction 5 x 5 seconds Cervical rotation AROM with shoulders back 10 x 5 seconds           Shoulder hike/shrug with scapular retraction 10 x 5 seconds Prone 90 & 100 degrees 10 x 2 seconds each  Thera-Band Red ER 10 x 3 seconds with slow eccentrics  Functional Activities: Shoulder Extension Thera-Band Red 10 x Palms up, 3 seconds hold, slow eccentrics (posture) Pull to chest Thera-Band Blue 20 x 3 seconds slow eccentrics (posture) Supine arm raises/Scapular protraction 2 sets of 10 with  3# and 5# weight (5# next time) 3 seconds (reaching)   10/29/2023 Scapular retraction 5 x 5 seconds Cervical rotation AROM with shoulders back 10 x 5 seconds           Shoulder hike/shrug with scapular retraction 10 x 5 seconds Prone 90 & 100 degrees 5 x each  Thera-Band Red ER 10 x 3 seconds  Functional Activities: Shoulder screen which shows  significant posterior rotator cuff weakness on the right side.  Reviewed and updated her home exercise program incorporating findings from her shoulder screen today.   10/08/2023 Scapular retraction 5 x 5 seconds Cervical rotation AROM with shoulders back 10 x 5 seconds           Shoulder hike/shrug with scapular retraction 10 x 5 seconds  57846: Reviewed cervical spine anatomy basics (posture, cervical lordosis), discussed sleeping postures and pillow recommendations, sitting postures, lumbar roll use, the importance of frequent changes in position, reading posture and the importance of walking 3 times a week for 20+ minutes if her knee will allow it.  Reviewed examination findings and day 1 home exercise program.  Discussed expectations and the expected timeframe for supervised physical therapy.                                                                                                                    PATIENT EDUCATION:  Education details: See above Person educated: Patient Education method: Explanation, Demonstration, Tactile cues, Verbal cues, and Handouts Education comprehension: verbalized understanding, returned demonstration, verbal cues required, tactile cues required, and needs further education  HOME EXERCISE PROGRAM: Access Code: [email or web address] URL: [email or web address] Date: 11/04/2023 Prepared by: Terral Ferrari  Exercises - Standing Scapular Retraction  - 5 x daily - 7 x weekly - 1 sets - 5 reps - 5 second hold - Standing Shoulder Shrugs with Dumbbells  - 5 x daily - 7 x weekly - 1 sets - 5 reps - 5 seconds hold - Seated Cervical Rotation AROM  - 1 x daily - 7 x weekly - 1 sets - 10 reps - 5 seconds hold - Prone Shoulder Horizontal Abduction with Thumbs Up  - 1 x daily - 7 x weekly - 1-2 sets - 5-10 reps - 3-5 seconds hold - Shoulder External Rotation with Anchored Resistance  - 1 x daily - 7 x weekly - 2 sets - 10 reps - 3 second hold - Shoulder Extension  with Resistance  - 1 x daily - 3 x weekly - 2-3 sets - 10 reps - 3 second hold - Standing Shoulder Row with Anchored Resistance  - 1 x daily - 3 x weekly - 2-3 sets - 20 reps - 3 second hold - Supine Scapular Protraction in Flexion with Dumbbells  - 1 x daily - 7 x weekly - 2-3 sets - 20 reps - 3 seconds hold  ASSESSMENT:  CLINICAL IMPRESSION: Shanise continues to do a good job with her home exercises.  The focus today was more on her right shoulder  due to significant scapular, postural and right shoulder posterior rotator cuff weakness.  We progressed activities to address this and will need to continue to progress her strengthening as appropriate to meet long-term goals.   Patient is a 73 y.o. female who was seen today for physical therapy evaluation and treatment for M54.2 (ICD-10-CM) - Cervicalgia.  Rye has chronic cervical spine pain that she has been dealing with for several years.  She appears to be very knowledgeable and compliant with her early physical therapy and would like some updates and tips to help her better manage her symptoms.  We also discussed possibly doing a shoulder screen on her next visit as her right upper arm pain might also have a shoulder contribution.  Postural limitations were most significant as her active range of motion only had mild impairments and her cervical strength was actually quite good.  Will focus on postural and scapular strengthening, posture and body mechanics education and a shoulder screen on her neck supervised visit.  OBJECTIVE IMPAIRMENTS: decreased activity tolerance, decreased endurance, decreased ROM, decreased strength, impaired perceived functional ability, impaired UE functional use, improper body mechanics, postural dysfunction, and pain.   ACTIVITY LIMITATIONS: carrying, lifting, sleeping, and reach over head  PARTICIPATION LIMITATIONS: driving and community activity  PERSONAL FACTORS: Rt meniscus repair, HTN are also affecting patient's  functional outcome.   REHAB POTENTIAL: Good  CLINICAL DECISION MAKING: Stable/uncomplicated  EVALUATION COMPLEXITY: Moderate   GOALS: Goals reviewed with patient? Yes  SHORT TERM GOALS: Target date: 11/05/2023  Anjolie will be independent with her day 1 home exercise program Baseline: Started 10/08/2023 Goal status: Met 10/29/2023  2.  Improve cervical active range of motion for lateral bending to 25 degrees and rotation to 60 degrees Baseline: 25/20 and 55/45 respectively Goal status: Met 10/29/2023  3.  Adrionna will have an improved postural awareness and will be able to complete this into all static and dynamic activities Baseline: See postural abnormalities noted above Goal status: Met 11/04/2023   LONG TERM GOALS: Target date: 12/03/2023  Improve patient's specific functional scale to an average of 8 Baseline: Average of 5 Goal status: INITIAL  2.  Improve cervical, right upper trapezius, scapular and right arm pain to consistently 0-2/10 on the visual analog scale Baseline: 0-3/10 Goal status: INITIAL  3.  Improve scapular and postural strength as assessed by performance with functional activities and subjective self-report Baseline: See postural abnormalities above Goal status: INITIAL  4.  Thai will be independent with her long-term maintenance home exercise program Baseline: Started 10/08/2023 Goal status: INITIAL   PLAN:  PT FREQUENCY: 1-2x/week  PT DURATION: 8 weeks  PLANNED INTERVENTIONS: 97110-Therapeutic exercises, 97530- Therapeutic activity, 97112- Neuromuscular re-education, 97535- Self Care, 16109- Manual therapy, 97012- Traction (mechanical), Patient/Family education, Dry Needling, Cryotherapy, and Moist heat  PLAN FOR NEXT SESSION: Review her current home exercise program and make appropriate progressions for scapular, posterior rotator cuff and postural strength.  If time allows, review her previous physical therapy exercises and spend time on posture  and body mechanics education.   Joli Neas, PT, MPT 11/04/2023, 3:01 PM

## 2023-11-11 ENCOUNTER — Encounter: Admitting: Rehabilitative and Restorative Service Providers"

## 2023-11-25 ENCOUNTER — Telehealth: Payer: Self-pay | Admitting: Sports Medicine

## 2023-11-25 ENCOUNTER — Encounter: Payer: Self-pay | Admitting: Rehabilitative and Restorative Service Providers"

## 2023-11-25 ENCOUNTER — Ambulatory Visit: Admitting: Rehabilitative and Restorative Service Providers"

## 2023-11-25 DIAGNOSIS — M542 Cervicalgia: Secondary | ICD-10-CM

## 2023-11-25 DIAGNOSIS — R293 Abnormal posture: Secondary | ICD-10-CM

## 2023-11-25 NOTE — Telephone Encounter (Signed)
 Patient came in and she had questions to see if what she needed was the shockwave machine. CB#[phone]

## 2023-11-25 NOTE — Therapy (Signed)
 OUTPATIENT PHYSICAL THERAPY CERVICAL TREATMENT/PROGRESS NOTE Progress Note Reporting Period 10/08/2023 to 11/25/2023  See note below for Objective Data and Assessment of Progress/Goals.      Patient Name: Sharon Mack MRN: [id number] DOB:[birth year], 73 y.o., female Today's Date: 11/25/2023  END OF SESSION:  PT End of Session - 11/25/23 0932     Visit Number 4    Number of Visits 16    Date for PT Re-Evaluation 01/03/24    Authorization Type MEDICARE & BCBS    Progress Note Due on Visit 10    PT Start Time 0932    PT Stop Time 1016    PT Time Calculation (min) 44 min    Activity Tolerance Patient tolerated treatment well;No increased pain    Behavior During Therapy WFL for tasks assessed/performed             Past Medical History:  Diagnosis Date   Hypertension    Past Surgical History:  Procedure Laterality Date   KNEE ARTHROSCOPY WITH MENISCAL REPAIR  2014   Sub Urethral Cyst     URETHRAL CYST REMOVAL     Patient Active Problem List   Diagnosis Date Noted   Elevated blood pressure 01/05/2016   Weight gain 01/05/2016    PCP: NA  REFERRING PROVIDER: Ronal LITTIE Grave PA-C  REFERRING DIAG: M54.2 (ICD-10-CM) - Cervicalgia  THERAPY DIAG:  Abnormal posture - Plan: PT plan of care cert/re-cert  Cervicalgia - Plan: PT plan of care cert/re-cert  Rationale for Evaluation and Treatment: Rehabilitation  ONSET DATE: Chronic  SUBJECTIVE:                                                                                                                                                                                                         SUBJECTIVE STATEMENT: Sharon Mack continues her excellent home exercise program compliance.  Although overall progress is noted, she still has difficulty sleeping on her right side and with reaching with her right arm.  Sharon Mack has been in and out of physical therapy since the pandemic.  Symptoms developed gradually.  She notes  symptoms in the right upper trapezius and occasionally upper arm.  Hand dominance: Right  PERTINENT HISTORY:  Rt meniscus repair, HTN  PAIN:  Are you having pain? Yes: NPRS scale: 0-1/10 Rt shoulder > neck this week on the Numeric Pain Rating Scale Pain location: See above Pain description: Ache Aggravating factors: Sleeping on the right Relieving factors: PT exercises, TENS  PRECAUTIONS: Cervical  RED FLAGS: None     WEIGHT BEARING RESTRICTIONS: No  FALLS:  Has patient fallen in last 6 months? No  LIVING ENVIRONMENT: Lives with: lives alone Lives in: House/apartment Stairs: Has a previous right meniscus scope. Has following equipment at home: None  OCCUPATION: Retired NP in OB/GYN  PLOF: Independent  PATIENT GOALS: Return to roller skating, total gym, treadmill.  Better AROM, strength, less discomfort (particularly right upper trapezius).  NEXT MD VISIT: 10/20/2023 Dr. Burnetta  OBJECTIVE:  Note: Objective measures were completed at Evaluation unless otherwise noted.  DIAGNOSTIC FINDINGS:  Unavailable 10/08/2023  PATIENT SURVEYS:  Patient-Specific Activity Scoring Scheme  0 represents "unable to perform." 10 represents "able to perform at prior level. 0 1 2 3 4 5 6 7 8 9  10 (Date and Score)   Activity Eval   11/25/2023  1. Roller skating  0/10 10/10   2. Rt arm use  7/10  9/10  3. Checking mirrors in car 8/10 10/10  4.    5.    Score 5  AVG 9.67 AVG   Total score = sum of the activity scores/number of activities Minimum detectable change (90%CI) for average score = 2 points Minimum detectable change (90%CI) for single activity score = 3 points     COGNITION: Overall cognitive status: Within functional limits for tasks assessed  SENSATION: No peripheral paresthesias are noted  POSTURE: rounded shoulders, forward head, decreased lumbar lordosis, and flexed trunk    CERVICAL ROM:   Active ROM A/PROM (deg) eval AROM (deg) 10/29/2023 11/25/2023   Flexion     Extension 65 70   Right lateral flexion 20 30   Left lateral flexion 25 30   Right rotation 45 60   Left rotation 55 60    (Blank rows = not tested)  UPPER EXTREMITY ROM:  Passive ROM Left/Right 10/29/2023 Left/Right 11/25/2023  Shoulder flexion 175/170 180/180  Shoulder extension    Shoulder abduction    Shoulder horizontal adduction 45/45 45/50  Shoulder extension    Shoulder internal rotation 90/75 90/80  Shoulder external rotation 90/95 95/90  Elbow flexion    Elbow extension    Wrist flexion    Wrist extension    Wrist ulnar deviation    Wrist radial deviation    Wrist pronation    Wrist supination     (Blank rows = not tested)  STRENGTH:  Assessed in pounds with hand-held dynamometer Left/Right 10/08/2023 Left/Right 10/29/2023 Right  11/04/2023 Left/Right 11/24/2023  Shoulder flexion      Shoulder extension      Shoulder abduction      Shoulder adduction      Shoulder extension      Shoulder internal rotation  29.7/27.9 27.8 34.5/33.0  Shoulder external rotation  23.3/13.9 12.8 22.4/13.8  Middle trapezius      Lower trapezius      Elbow flexion      Elbow extension      Wrist flexion      Wrist extension      Wrist ulnar deviation      Wrist radial deviation      Wrist pronation      Wrist supination      Grip strength      Cervical Extension  49.3     Cervical Lateral Bending Left 36.2     Cervical Lateral Bending Right 36.1      (Blank rows = not tested)   TREATMENT DATE:  11/25/2023 Scapular retraction 5 x 5 seconds Cervical rotation AROM with shoulders back 10 x 5 seconds  Shoulder hike/shrug with scapular retraction 10 x 5 seconds Prone 90 & 100 degrees 10 x 2 seconds each  Thera-Band Green ER 10 x 3 seconds with slow eccentrics  Functional Activities: Shoulder Extension Thera-Band Red 10 x Palms up, 3 seconds hold, slow eccentrics (posture) Pull to chest Thera-Band Blue 20 x 3 seconds slow eccentrics (posture) Supine arm  raises/Scapular protraction 20 with 8# weight 3 seconds (reaching) Review reassessment findings and make updates to her home exercise program   11/04/2023 Scapular retraction 5 x 5 seconds Cervical rotation AROM with shoulders back 10 x 5 seconds           Shoulder hike/shrug with scapular retraction 10 x 5 seconds Prone 90 & 100 degrees 10 x 2 seconds each  Thera-Band Red ER 10 x 3 seconds with slow eccentrics  Functional Activities: Shoulder Extension Thera-Band Red 10 x Palms up, 3 seconds hold, slow eccentrics (posture) Pull to chest Thera-Band Blue 20 x 3 seconds slow eccentrics (posture) Supine arm raises/Scapular protraction 2 sets of 10 with 3# and 5# weight (5# next time) 3 seconds (reaching)   10/29/2023 Scapular retraction 5 x 5 seconds Cervical rotation AROM with shoulders back 10 x 5 seconds           Shoulder hike/shrug with scapular retraction 10 x 5 seconds Prone 90 & 100 degrees 5 x each  Thera-Band Red ER 10 x 3 seconds  Functional Activities: Shoulder screen which shows significant posterior rotator cuff weakness on the right side.  Reviewed and updated her home exercise program incorporating findings from her shoulder screen today.   PATIENT EDUCATION:  Education details: See above Person educated: Patient Education method: Explanation, Demonstration, Tactile cues, Verbal cues, and Handouts Education comprehension: verbalized understanding, returned demonstration, verbal cues required, tactile cues required, and needs further education  HOME EXERCISE PROGRAM: Access Code: [email or web address] URL: [email or web address] Date: 11/04/2023 Prepared by: Lamar Ivory  Exercises - Standing Scapular Retraction  - 5 x daily - 7 x weekly - 1 sets - 5 reps - 5 second hold - Standing Shoulder Shrugs with Dumbbells  - 5 x daily - 7 x weekly - 1 sets - 5 reps - 5 seconds hold - Seated Cervical Rotation AROM  - 1 x daily - 7 x weekly - 1 sets - 10 reps - 5 seconds  hold - Prone Shoulder Horizontal Abduction with Thumbs Up  - 1 x daily - 7 x weekly - 1-2 sets - 5-10 reps - 3-5 seconds hold - Shoulder External Rotation with Anchored Resistance  - 1 x daily - 7 x weekly - 2 sets - 10 reps - 3 second hold - Shoulder Extension with Resistance  - 1 x daily - 3 x weekly - 2-3 sets - 10 reps - 3 second hold - Standing Shoulder Row with Anchored Resistance  - 1 x daily - 3 x weekly - 2-3 sets - 20 reps - 3 second hold - Supine Scapular Protraction in Flexion with Dumbbells  - 1 x daily - 7 x weekly - 2-3 sets - 20 reps - 3 seconds hold  ASSESSMENT:  CLINICAL IMPRESSION: Sharon Mack has made excellent overall progress with her supervised physical therapy.  Of some concern is the lack of progress with her right shoulder external rotation strength.  This typically is a strong indicator of a possible rotator cuff tear.  This is also consistent with Sharon Mack's reported difficulty sleeping on the right shoulder.  Sharon Mack was encouraged to follow-up with an orthopedic  specialist for additional testing such as MRI if she would like to go that route.  Sharon Mack is happy with her progress with her physical therapy, but she would like to get additional medical feedback before determining her next course of action.  In the meantime, she has requested a few more physical therapy visits to answer questions with an update her current home exercises as appropriate.   Patient is a 73 y.o. female who was seen today for physical therapy evaluation and treatment for M54.2 (ICD-10-CM) - Cervicalgia.  Sharon Mack has chronic cervical spine pain that she has been dealing with for several years.  She appears to be very knowledgeable and compliant with her early physical therapy and would like some updates and tips to help her better manage her symptoms.  We also discussed possibly doing a shoulder screen on her next visit as her right upper arm pain might also have a shoulder contribution.  Postural limitations were  most significant as her active range of motion only had mild impairments and her cervical strength was actually quite good.  Will focus on postural and scapular strengthening, posture and body mechanics education and a shoulder screen on her neck supervised visit.  OBJECTIVE IMPAIRMENTS: decreased activity tolerance, decreased endurance, decreased ROM, decreased strength, impaired perceived functional ability, impaired UE functional use, improper body mechanics, postural dysfunction, and pain.   ACTIVITY LIMITATIONS: carrying, lifting, sleeping, and reach over head  PARTICIPATION LIMITATIONS: driving and community activity  PERSONAL FACTORS: Rt meniscus repair, HTN are also affecting patient's functional outcome.   REHAB POTENTIAL: Good  CLINICAL DECISION MAKING: Stable/uncomplicated  EVALUATION COMPLEXITY: Moderate   GOALS: Goals reviewed with patient? Yes  SHORT TERM GOALS: Target date: 11/05/2023  Sharon Mack will be independent with her day 1 home exercise program Baseline: Started 10/08/2023 Goal status: Met 10/29/2023  2.  Improve cervical active range of motion for lateral bending to 25 degrees and rotation to 60 degrees Baseline: 25/20 and 55/45 respectively Goal status: Met 10/29/2023  3.  Sharon Mack will have an improved postural awareness and will be able to complete this into all static and dynamic activities Baseline: See postural abnormalities noted above Goal status: Met 11/04/2023   LONG TERM GOALS: Target date: 01/03/2024  Improve patient's specific functional scale to an average of 8 Baseline: Average of 5 Goal status: Met 11/25/2023 2.  Improve cervical, right upper trapezius, scapular and right arm pain to consistently 0-2/10 on the visual analog scale Baseline: 0-3/10 Goal status: Met 11/25/2023  3.  Improve scapular and postural strength as assessed by performance with functional activities and subjective self-report Baseline: See postural abnormalities above Goal status:  Partially met 11/25/2023  4.  Sharon Mack will be independent with her long-term maintenance home exercise program Baseline: Started 10/08/2023 Goal status: Partially met 11/25/2023  PLAN:  PT FREQUENCY: 1-4 additional visits  PT DURATION: 1 month  PLANNED INTERVENTIONS: 97110-Therapeutic exercises, 97530- Therapeutic activity, 97112- Neuromuscular re-education, 97535- Self Care, 02859- Manual therapy, 97012- Traction (mechanical), Patient/Family education, Dry Needling, Cryotherapy, and Moist heat  PLAN FOR NEXT SESSION: Make appropriate progressions for scapular, posterior rotator cuff and postural strength.  If time allows, review her previous physical therapy exercises and spend time on posture and body mechanics education.   Myer LELON Ivory, PT, MPT 11/25/2023, 5:39 PM

## 2023-12-09 ENCOUNTER — Ambulatory Visit (INDEPENDENT_AMBULATORY_CARE_PROVIDER_SITE_OTHER): Admitting: Sports Medicine

## 2023-12-09 ENCOUNTER — Encounter: Payer: Self-pay | Admitting: Sports Medicine

## 2023-12-09 DIAGNOSIS — G8929 Other chronic pain: Secondary | ICD-10-CM

## 2023-12-09 DIAGNOSIS — M170 Bilateral primary osteoarthritis of knee: Secondary | ICD-10-CM | POA: Diagnosis not present

## 2023-12-09 DIAGNOSIS — M25561 Pain in right knee: Secondary | ICD-10-CM | POA: Diagnosis not present

## 2023-12-09 DIAGNOSIS — M25562 Pain in left knee: Secondary | ICD-10-CM

## 2023-12-09 NOTE — Progress Notes (Signed)
 Sharon Mack - 73 y.o. female MRN [id number]  Date of birth: 1951-03-20  Office Visit Note: Visit Date: 12/09/2023 PCP: Pcp, No Referred by: No ref. provider found  Subjective: Chief Complaint  Patient presents with   Right Knee - Follow-up   Left Knee - Follow-up   HPI: Sharon Mack is a pleasant 73 y.o. female who presents today for follow-up of chronic bilateral knee pain.  Last saw Barnie back in May.  She has been doing well and progressing through PT for her shoulder.  She does have bilateral knee osteoarthritis worse in the medial tibiofemoral compartment, right slightly greater than left.  She actually has been having some crepitus and a mild twinge over the anterior aspect of the patella on the left knee.  She is very fit and active individual, also is in support of holistic treatments.  She is not taking any consistent medication for this.  She has received benefit from hyaluronic acid injections, she is interested in repeating this, would be due for this in mid September.  She also sees Dr. Kayla over in [place] and did receive [employer] PTM injection therapy for the right knee on 09/28/23. Did not tolerate PRP in past - reaction.   Pertinent ROS were reviewed with the patient and found to be negative unless otherwise specified above in HPI.   Assessment & Plan: Visit Diagnoses:  1. Patellofemoral arthralgia of left knee   2. Bilateral primary osteoarthritis of knee   3. Bilateral chronic knee pain    Plan: Impression is chronic bilateral knee pain with advancing medial tibiofemoral joint space narrowing and arthritic change of both knees.  She remains a very active, skating and other physical activity.  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  We did evaluate her quadricep musculature and she is midline to lateral dominant with a degree of VMO insufficiency.  I did give her a  few straight leg raise exercises for her to start to do on her own, but I will also send a PT referral to [facility] to focus more of her PT on this compared to her shoulder.  She is interested in hyaluronic acid injections, we will set this up for her in mid September.  She was seen and evaluated by our rep, Aleck, who reviewed and went through her SAM 2.0 device that she can wear for her bilateral knees.  She is seeing Dr. Kayla at [facility] (PTM injection therapy R-knee recently) and will continue with this Ortho biologic treatment in conjunction with our care.  Follow-up: Return in about 8 weeks (around 02/02/2024) for schedule for US -guided b/l HA injections (30-min).   Meds & Orders: No orders of the defined types were placed in this encounter.  No orders of the defined types were placed in this encounter.    Procedures: No procedures performed      Clinical History: No specialty comments available.  She reports that she has never smoked. She has never used smokeless tobacco. No results for input(s): HGBA1C, LABURIC in the last 8760 hours.  Objective:    Physical Exam  Gen: Well-appearing, in no acute distress; non-toxic CV: Well-perfused. Warm.  Resp: Breathing unlabored on room air; no wheezing. Psych: Fluid speech in conversation; appropriate affect; normal thought process  Ortho Exam - Bilateral knees: There is no significant redness swelling or effusion of either knee.  Range of motion is relatively well-preserved, right knee 0-120 degrees, left knee 0-125  degrees.  There is mild right knee patellofemoral crepitus, there is moderate to severe left knee patellofemoral crepitus with some tenderness at the superior pole of the patella.  There is mild insufficiency of the VMO compared to the mid and lateral aspects of the quadricep musculature bilaterally.  Imaging:  10/20/23: XR Knee 1-2 Views Right 2 view bilateral knee including standing AP and Cecillia view were  ordered  and reviewed by myself today.  X-rays demonstrate mild  tricompartmental arthritic change with moderate to severe narrowing over  bilateral medial tibiofemoral joint spaces.  There is osteophytosis and  spurring off the right knee medial femoral condyle and medial proximal  tibia.  Cecillia view shows left medial joint space significantly  narrowed.  Past Medical/Family/Surgical/Social History: Medications & Allergies reviewed per EMR, new medications updated. Patient Active Problem List   Diagnosis Date Noted   Elevated blood pressure 01/05/2016   Weight gain 01/05/2016   Past Medical History:  Diagnosis Date   Hypertension    Family History  Problem Relation Age of Onset   Alcohol abuse Mother        died from liver cirrhosis, at age 46   Past Surgical History:  Procedure Laterality Date   KNEE ARTHROSCOPY WITH MENISCAL REPAIR  2014   Sub Urethral Cyst     URETHRAL CYST REMOVAL     Social History   Occupational History   Not on file  Tobacco Use   Smoking status: Never   Smokeless tobacco: Never  Substance and Sexual Activity   Alcohol use: No   Drug use: No   Sexual activity: Not on file   I spent 34 minutes in the care of the patient today including face-to-face time, preparation to see the patient, as well as review of previous imaging, discussion on Ortho Biologics and additional injection therapy outside of corticosteroid, functional strength evaluation with discussion on home exercises and PT adjustment, time spent with SAM 2.0 application discussion for the above diagnoses.   Lonell Sprang, DO Primary Care Sports Medicine Physician  [facility]  This note was dictated using Dragon naturally speaking software and may contain errors in syntax, spelling, or content which have not been identified prior to signing this note.

## 2023-12-16 ENCOUNTER — Encounter: Payer: Self-pay | Admitting: Rehabilitative and Restorative Service Providers"

## 2023-12-16 ENCOUNTER — Ambulatory Visit (INDEPENDENT_AMBULATORY_CARE_PROVIDER_SITE_OTHER): Admitting: Rehabilitative and Restorative Service Providers"

## 2023-12-16 DIAGNOSIS — R293 Abnormal posture: Secondary | ICD-10-CM

## 2023-12-16 DIAGNOSIS — M542 Cervicalgia: Secondary | ICD-10-CM

## 2023-12-16 NOTE — Therapy (Signed)
 OUTPATIENT PHYSICAL THERAPY CERVICAL TREATMENT NOTE     Patient Name: Sharon Mack MRN: [id number] DOB:08-31-1950, 73 y.o., female Today's Date: 12/16/2023  END OF SESSION:  PT End of Session - 12/16/23 1521     Visit Number 5    Number of Visits 16    Date for PT Re-Evaluation 01/03/24    Authorization Type MEDICARE & BCBS    Progress Note Due on Visit 10    PT Start Time 1518    PT Stop Time 1559    PT Time Calculation (min) 41 min    Activity Tolerance Patient tolerated treatment well;No increased pain    Behavior During Therapy WFL for tasks assessed/performed              Past Medical History:  Diagnosis Date   Hypertension    Past Surgical History:  Procedure Laterality Date   KNEE ARTHROSCOPY WITH MENISCAL REPAIR  2014   Sub Urethral Cyst     URETHRAL CYST REMOVAL     Patient Active Problem List   Diagnosis Date Noted   Elevated blood pressure 01/05/2016   Weight gain 01/05/2016    PCP: NA  REFERRING PROVIDER: Ronal LITTIE Grave PA-C  REFERRING DIAG: M54.2 (ICD-10-CM) - Cervicalgia  THERAPY DIAG:  Abnormal posture  Cervicalgia  Rationale for Evaluation and Treatment: Rehabilitation  ONSET DATE: Chronic  SUBJECTIVE:                                                                                                                                                                                                         SUBJECTIVE STATEMENT: Thi continues to have excellent home exercise program compliance.  Although overall progress is noted, she still has difficulty  Sharon Mack has been in and out of physical therapy since the pandemic.  Symptoms developed gradually.  She notes symptoms in the right upper trapezius and occasionally upper arm.  Hand dominance: Right  PERTINENT HISTORY:  Rt meniscus repair, HTN  PAIN:  Are you having pain? Yes: NPRS scale: 0-1/10 Rt shoulder > neck this week on the Numeric Pain Rating Scale Pain location:  See above Pain description: Ache Aggravating factors: Sleeping on the right Relieving factors: PT exercises, TENS  PRECAUTIONS: Cervical  RED FLAGS: None     WEIGHT BEARING RESTRICTIONS: No  FALLS:  Has patient fallen in last 6 months? No  LIVING ENVIRONMENT: Lives with: lives alone Lives in: House/apartment Stairs: Has a previous right meniscus scope. Has following equipment at home: None  OCCUPATION: Retired NP in OB/GYN  PLOF: Independent  PATIENT  GOALS: Return to roller skating, total gym, treadmill.  Better AROM, strength, less discomfort (particularly right upper trapezius).  NEXT MD VISIT: 10/20/2023 Dr. Burnetta  OBJECTIVE:  Note: Objective measures were completed at Evaluation unless otherwise noted.  DIAGNOSTIC FINDINGS:  Unavailable 10/08/2023  PATIENT SURVEYS:  Patient-Specific Activity Scoring Scheme  0 represents "unable to perform." 10 represents "able to perform at prior level. 0 1 2 3 4 5 6 7 8 9  10 (Date and Score)   Activity Eval   11/25/2023  1. Roller skating  0/10 10/10   2. Rt arm use  7/10  9/10  3. Checking mirrors in car 8/10 10/10  4.    5.    Score 5  AVG 9.67 AVG   Total score = sum of the activity scores/number of activities Minimum detectable change (90%CI) for average score = 2 points Minimum detectable change (90%CI) for single activity score = 3 points     COGNITION: Overall cognitive status: Within functional limits for tasks assessed  SENSATION: No peripheral paresthesias are noted  POSTURE: rounded shoulders, forward head, decreased lumbar lordosis, and flexed trunk    CERVICAL ROM:   Active ROM A/PROM (deg) eval AROM (deg) 10/29/2023 11/25/2023  Flexion     Extension 65 70   Right lateral flexion 20 30   Left lateral flexion 25 30   Right rotation 45 60   Left rotation 55 60    (Blank rows = not tested)  UPPER EXTREMITY ROM:  Passive ROM Left/Right 10/29/2023 Left/Right 11/25/2023  Shoulder flexion 175/170  180/180  Shoulder extension    Shoulder abduction    Shoulder horizontal adduction 45/45 45/50  Shoulder extension    Shoulder internal rotation 90/75 90/80  Shoulder external rotation 90/95 95/90  Elbow flexion    Elbow extension    Wrist flexion    Wrist extension    Wrist ulnar deviation    Wrist radial deviation    Wrist pronation    Wrist supination     (Blank rows = not tested)  STRENGTH:  Assessed in pounds with hand-held dynamometer Left/Right 10/08/2023 Left/Right 10/29/2023 Right  11/04/2023 Left/Right 11/24/2023 Left/Right 12/16/2023  Shoulder flexion       Shoulder extension       Shoulder abduction       Shoulder adduction       Shoulder extension       Shoulder internal rotation  29.7/27.9 27.8 34.5/33.0 32.1  Shoulder external rotation  23.3/13.9 12.8 22.4/13.8 15.8  Middle trapezius       Lower trapezius       Elbow flexion       Elbow extension       Wrist flexion       Wrist extension       Wrist ulnar deviation       Wrist radial deviation       Wrist pronation       Wrist supination       Grip strength       Cervical Extension  49.3      Cervical Lateral Bending Left 36.2      Cervical Lateral Bending Right 36.1       (Blank rows = not tested)   TREATMENT DATE:  12/16/2022 Prone 90 & 100 degrees 10 x 2 seconds each  Thera-Band Green ER 10 x 3 seconds with slow eccentrics Seated straight leg raises 2 sets of 5 for 3 seconds 2# slow eccentrics  Functional Activities: Shoulder  Extension Thera-Band Red 10 x Palms up, 3 seconds hold, slow eccentrics (posture) Pull to chest Thera-Band Blue 20 x 3 seconds slow eccentrics (posture) Supine arm raises/Scapular protraction 20 with 8# weight 3 seconds (reaching) Review reassessment findings and make updates to her home exercise program   11/25/2023 Scapular retraction 5 x 5 seconds Cervical rotation AROM with shoulders back 10 x 5 seconds           Shoulder hike/shrug with scapular retraction 10 x 5  seconds Prone 90 & 100 degrees 10 x 2 seconds each  Thera-Band Green ER 10 x 3 seconds with slow eccentrics  Functional Activities: Shoulder Extension Thera-Band Red 10 x Palms up, 3 seconds hold, slow eccentrics (posture) Pull to chest Thera-Band Blue 20 x 3 seconds slow eccentrics (posture) Supine arm raises/Scapular protraction 20 with 8# weight 3 seconds (reaching) Review reassessment findings and make updates to her home exercise program   11/04/2023 Scapular retraction 5 x 5 seconds Cervical rotation AROM with shoulders back 10 x 5 seconds           Shoulder hike/shrug with scapular retraction 10 x 5 seconds Prone 90 & 100 degrees 10 x 2 seconds each  Thera-Band Red ER 10 x 3 seconds with slow eccentrics  Functional Activities: Shoulder Extension Thera-Band Red 10 x Palms up, 3 seconds hold, slow eccentrics (posture) Pull to chest Thera-Band Blue 20 x 3 seconds slow eccentrics (posture) Supine arm raises/Scapular protraction 2 sets of 10 with 3# and 5# weight (5# next time) 3 seconds (reaching)   PATIENT EDUCATION:  Education details: See above Person educated: Patient Education method: Explanation, Demonstration, Tactile cues, Verbal cues, and Handouts Education comprehension: verbalized understanding, returned demonstration, verbal cues required, tactile cues required, and needs further education  HOME EXERCISE PROGRAM: Access Code: [email or web address] URL: [email or web address] Date: 12/16/2023 Prepared by: Lamar Ivory  Exercises - Standing Scapular Retraction  - 3-5 x daily - 7 x weekly - 1 sets - 5 reps - 5 second hold - Standing Shoulder Shrugs with Dumbbells  - 3-5 x daily - 7 x weekly - 1 sets - 5 reps - 5 seconds hold - Seated Cervical Rotation AROM  - 1 x daily - 7 x weekly - 1 sets - 10 reps - 5 seconds hold - Prone Shoulder Horizontal Abduction with Thumbs Up  - 1 x daily - 3-4 x weekly - 1-2 sets - 5-10 reps - 3-5 seconds hold - Shoulder External  Rotation with Anchored Resistance  - 1 x daily - 3-4 x weekly - 2 sets - 10 reps - 3 second hold - Shoulder Extension with Resistance  - 1 x daily - 3 x weekly - 2-3 sets - 10 reps - 3 second hold - Standing Shoulder Row with Anchored Resistance  - 1 x daily - 3 x weekly - 2-3 sets - 20 reps - 3 second hold - Supine Scapular Protraction in Flexion with Dumbbells  - 1 x daily - 3-4 x weekly - 2-3 sets - 20 reps - 3 seconds hold - Small Range Straight Leg Raise  - 1 x daily - 3-4 x weekly - 5-10 sets - 10 reps - 3 seconds hold  ASSESSMENT:  CLINICAL IMPRESSION: Sable continues to have excellent HEP compliance.  She notes progress with her shoulder and cervical spine, although she does appear to have some overuse symptoms of the left shoulder and we discussed decreasing her home exercise program compliance with her shoulder strengthening to no more than  3-4 times per week versus the 2 times per day she is currently doing.  Camika should be able to maintain her current level of progress while avoiding overuse with this less frequent strengthening schedule.  We discussed doing another reassessment in about 2 weeks and if she is continuing to do as well as she is today with less overuse type symptoms, she should be ready for transfer into independent rehabilitation.  Patient is a 73 y.o. female who was seen today for physical therapy evaluation and treatment for M54.2 (ICD-10-CM) - Cervicalgia.  Nayanna has chronic cervical spine pain that she has been dealing with for several years.  She appears to be very knowledgeable and compliant with her early physical therapy and would like some updates and tips to help her better manage her symptoms.  We also discussed possibly doing a shoulder screen on her next visit as her right upper arm pain might also have a shoulder contribution.  Postural limitations were most significant as her active range of motion only had mild impairments and her cervical strength was actually  quite good.  Will focus on postural and scapular strengthening, posture and body mechanics education and a shoulder screen on her neck supervised visit.  OBJECTIVE IMPAIRMENTS: decreased activity tolerance, decreased endurance, decreased ROM, decreased strength, impaired perceived functional ability, impaired UE functional use, improper body mechanics, postural dysfunction, and pain.   ACTIVITY LIMITATIONS: carrying, lifting, sleeping, and reach over head  PARTICIPATION LIMITATIONS: driving and community activity  PERSONAL FACTORS: Rt meniscus repair, HTN are also affecting patient's functional outcome.   REHAB POTENTIAL: Good  CLINICAL DECISION MAKING: Stable/uncomplicated  EVALUATION COMPLEXITY: Moderate   GOALS: Goals reviewed with patient? Yes  SHORT TERM GOALS: Target date: 11/05/2023  Sonal will be independent with her day 1 home exercise program Baseline: Started 10/08/2023 Goal status: Met 10/29/2023  2.  Improve cervical active range of motion for lateral bending to 25 degrees and rotation to 60 degrees Baseline: 25/20 and 55/45 respectively Goal status: Met 10/29/2023  3.  Milca will have an improved postural awareness and will be able to complete this into all static and dynamic activities Baseline: See postural abnormalities noted above Goal status: Met 11/04/2023   LONG TERM GOALS: Target date: 01/03/2024  Improve patient's specific functional scale to an average of 8 Baseline: Average of 5 Goal status: Met 11/25/2023 2.  Improve cervical, right upper trapezius, scapular and right arm pain to consistently 0-2/10 on the visual analog scale Baseline: 0-3/10 Goal status: Met 11/25/2023  3.  Improve scapular and postural strength as assessed by performance with functional activities and subjective self-report Baseline: See postural abnormalities above Goal status: Partially met 12/16/2023  4.  Xaviera will be independent with her long-term maintenance home exercise  program Baseline: Started 10/08/2023 Goal status: Partially met 12/16/2023  PLAN:  PT FREQUENCY: 1-3 additional visits  PT DURATION: 1 month  PLANNED INTERVENTIONS: 97110-Therapeutic exercises, 97530- Therapeutic activity, 97112- Neuromuscular re-education, 97535- Self Care, 02859- Manual therapy, 97012- Traction (mechanical), Patient/Family education, Dry Needling, Cryotherapy, and Moist heat  PLAN FOR NEXT SESSION: Make appropriate progressions for scapular, posterior rotator cuff and postural strength.  Is she able to sleep on her right side and reach with her right arm?   Myer LELON Ivory, PT, MPT 12/16/2023, 4:10 PM

## 2023-12-27 ENCOUNTER — Ambulatory Visit: Admitting: Sports Medicine

## 2023-12-29 ENCOUNTER — Ambulatory Visit (INDEPENDENT_AMBULATORY_CARE_PROVIDER_SITE_OTHER): Admitting: Rehabilitative and Restorative Service Providers"

## 2023-12-29 ENCOUNTER — Encounter: Payer: Self-pay | Admitting: Rehabilitative and Restorative Service Providers"

## 2023-12-29 DIAGNOSIS — R293 Abnormal posture: Secondary | ICD-10-CM

## 2023-12-29 DIAGNOSIS — M542 Cervicalgia: Secondary | ICD-10-CM

## 2023-12-29 NOTE — Therapy (Signed)
 OUTPATIENT PHYSICAL THERAPY CERVICAL TREATMENT NOTE/DISCHARGE SUMMARY  PHYSICAL THERAPY DISCHARGE SUMMARY  Visits from Start of Care: 6  Current functional level related to goals / functional outcomes: See note   Remaining deficits: See note   Education / Equipment: See note   Patient agrees to discharge. Patient goals were met. Patient is being discharged due to meeting the stated rehab goals.      Patient Name: Sharon Mack MRN: [id number] DOB:[birth year], 73 y.o., female Today's Date: 12/29/2023  END OF SESSION:  PT End of Session - 12/29/23 1100     Visit Number 6    Number of Visits 16    Date for PT Re-Evaluation 01/03/24    Authorization Type MEDICARE & BCBS    Progress Note Due on Visit 10    PT Start Time 1101    PT Stop Time 1141    PT Time Calculation (min) 40 min    Activity Tolerance Patient tolerated treatment well;No increased pain    Behavior During Therapy WFL for tasks assessed/performed               Past Medical History:  Diagnosis Date   Hypertension    Past Surgical History:  Procedure Laterality Date   KNEE ARTHROSCOPY WITH MENISCAL REPAIR  2014   Sub Urethral Cyst     URETHRAL CYST REMOVAL     Patient Active Problem List   Diagnosis Date Noted   Elevated blood pressure 01/05/2016   Weight gain 01/05/2016    PCP: NA  REFERRING PROVIDER: Ronal LITTIE Grave PA-C  REFERRING DIAG: M54.2 (ICD-10-CM) - Cervicalgia  THERAPY DIAG:  Abnormal posture  Cervicalgia  Rationale for Evaluation and Treatment: Rehabilitation  ONSET DATE: Chronic  SUBJECTIVE:                                                                                                                                                                                                         SUBJECTIVE STATEMENT: Adreana notes minimal difficulty with her right shoulder, although nothing is really giving me much trouble.  HEP compliance remains excellent.  Boluwatife  has been in and out of physical therapy since the pandemic.  Symptoms developed gradually.  She notes symptoms in the right upper trapezius and occasionally upper arm.  Hand dominance: Right  PERTINENT HISTORY:  Rt meniscus repair, HTN  PAIN:  Are you having pain? Yes: NPRS scale: 0-1/10 Rt shoulder and 0/10 neck this week on the Numeric Pain Rating Scale Pain location: See above Pain description: Ache Aggravating factors: Sleeping on the right, not  all the time Relieving factors: PT exercises, TENS  PRECAUTIONS: Cervical  RED FLAGS: None     WEIGHT BEARING RESTRICTIONS: No  FALLS:  Has patient fallen in last 6 months? No  LIVING ENVIRONMENT: Lives with: lives alone Lives in: House/apartment Stairs: Has a previous right meniscus scope. Has following equipment at home: None  OCCUPATION: Retired NP in OB/GYN  PLOF: Independent  PATIENT GOALS: Return to roller skating, total gym, treadmill.  Better AROM, strength, less discomfort (particularly right upper trapezius).  NEXT MD VISIT: 02/02/2024  OBJECTIVE:  Note: Objective measures were completed at Evaluation unless otherwise noted.  DIAGNOSTIC FINDINGS:  Unavailable 10/08/2023  PATIENT SURVEYS:  Patient-Specific Activity Scoring Scheme  0 represents "unable to perform." 10 represents "able to perform at prior level. 0 1 2 3 4 5 6 7 8 9  10 (Date and Score)   Activity Eval   11/25/2023 12/29/2023  1. Roller skating  0/10 10/10  10  2. Rt arm use  7/10  9/10 9  3. Checking mirrors in car 8/10 10/10 10  4.     5.     Score 5  AVG 9.67 AVG 9.67   Total score = sum of the activity scores/number of activities Minimum detectable change (90%CI) for average score = 2 points Minimum detectable change (90%CI) for single activity score = 3 points     COGNITION: Overall cognitive status: Within functional limits for tasks assessed  SENSATION: No peripheral paresthesias are noted  POSTURE: rounded shoulders,  forward head, decreased lumbar lordosis, and flexed trunk    CERVICAL ROM:   Active ROM A/PROM (deg) eval AROM (deg) 10/29/2023 11/25/2023  Flexion     Extension 65 70   Right lateral flexion 20 30   Left lateral flexion 25 30   Right rotation 45 60   Left rotation 55 60    (Blank rows = not tested)  UPPER EXTREMITY ROM:  Passive ROM Left/Right 10/29/2023 Left/Right 11/25/2023  Shoulder flexion 175/170 180/180  Shoulder extension    Shoulder abduction    Shoulder horizontal adduction 45/45 45/50  Shoulder extension    Shoulder internal rotation 90/75 90/80  Shoulder external rotation 90/95 95/90  Elbow flexion    Elbow extension    Wrist flexion    Wrist extension    Wrist ulnar deviation    Wrist radial deviation    Wrist pronation    Wrist supination     (Blank rows = not tested)  STRENGTH:  Assessed in pounds with hand-held dynamometer Left/Right 10/08/2023 Left/Right 10/29/2023 Right  11/04/2023 Left/Right 11/24/2023 Left/Right 12/16/2023 Left/Right 12/29/2023  Shoulder flexion        Shoulder extension        Shoulder abduction        Shoulder adduction        Shoulder extension        Shoulder internal rotation  29.7/27.9 27.8 34.5/33.0 32.1 35.7/33.2  Shoulder external rotation  23.3/13.9 12.8 22.4/13.8 15.8 23.1/18.1  Middle trapezius        Lower trapezius        Elbow flexion        Elbow extension        Wrist flexion        Wrist extension        Wrist ulnar deviation        Wrist radial deviation        Wrist pronation        Wrist supination  Grip strength        Cervical Extension  49.3       Cervical Lateral Bending Left 36.2       Cervical Lateral Bending Right 36.1        (Blank rows = not tested)   TREATMENT DATE:  12/29/2023 Prone 90 & 100 degrees 10 x 2 seconds each  Thera-Band Green ER 10 x 3 seconds with slow eccentrics Seated straight leg raises 2 sets of 5 for 3 seconds 2# slow eccentrics  Functional Activities: Shoulder Extension  Thera-Band Red 10 x Palms up, 3 seconds hold, slow eccentrics (posture) Pull to chest Thera-Band Blue 20 x 3 seconds slow eccentrics (posture) Supine arm raises/Scapular protraction 20 with 8# weight 3 seconds (reaching) Review reassessment findings and make updates to her home exercise program   12/16/2022 Prone 90 & 100 degrees 10 x 2 seconds each  Thera-Band Green ER 10 x 3 seconds with slow eccentrics Seated straight leg raises 2 sets of 5 for 3 seconds 2# slow eccentrics  Functional Activities: Shoulder Extension Thera-Band Red 10 x Palms up, 3 seconds hold, slow eccentrics (posture) Pull to chest Thera-Band Blue 20 x 3 seconds slow eccentrics (posture) Supine arm raises/Scapular protraction 20 with 8# weight 3 seconds (reaching) Review reassessment findings and make updates to her home exercise program   11/25/2023 Scapular retraction 5 x 5 seconds Cervical rotation AROM with shoulders back 10 x 5 seconds           Shoulder hike/shrug with scapular retraction 10 x 5 seconds Prone 90 & 100 degrees 10 x 2 seconds each  Thera-Band Green ER 10 x 3 seconds with slow eccentrics  Functional Activities: Shoulder Extension Thera-Band Red 10 x Palms up, 3 seconds hold, slow eccentrics (posture) Pull to chest Thera-Band Blue 20 x 3 seconds slow eccentrics (posture) Supine arm raises/Scapular protraction 20 with 8# weight 3 seconds (reaching) Review reassessment findings and make updates to her home exercise program   PATIENT EDUCATION:  Education details: See above Person educated: Patient Education method: Explanation, Demonstration, Tactile cues, Verbal cues, and Handouts Education comprehension: verbalized understanding, returned demonstration, verbal cues required, tactile cues required, and needs further education  HOME EXERCISE PROGRAM: Access Code: [email or web address] URL: [email or web address] Date: 12/16/2023 Prepared by: Lamar Ivory  Exercises - Standing Scapular  Retraction  - 3-5 x daily - 7 x weekly - 1 sets - 5 reps - 5 second hold - Standing Shoulder Shrugs with Dumbbells  - 3-5 x daily - 7 x weekly - 1 sets - 5 reps - 5 seconds hold - Seated Cervical Rotation AROM  - 1 x daily - 7 x weekly - 1 sets - 10 reps - 5 seconds hold - Prone Shoulder Horizontal Abduction with Thumbs Up  - 1 x daily - 3-4 x weekly - 1-2 sets - 5-10 reps - 3-5 seconds hold - Shoulder External Rotation with Anchored Resistance  - 1 x daily - 3-4 x weekly - 2 sets - 10 reps - 3 second hold - Shoulder Extension with Resistance  - 1 x daily - 3 x weekly - 2-3 sets - 10 reps - 3 second hold - Standing Shoulder Row with Anchored Resistance  - 1 x daily - 3 x weekly - 2-3 sets - 20 reps - 3 second hold - Supine Scapular Protraction in Flexion with Dumbbells  - 1 x daily - 3-4 x weekly - 2-3 sets - 20 reps - 3 seconds hold - Seated Straight  Leg Raise  - 1 x daily - 3-4 x weekly - 5-10 sets - 10 reps - 3 seconds hold  ASSESSMENT:  CLINICAL IMPRESSION: Christal continues to have excellent HEP compliance.  She notes minimal functional impairments with her shoulder and no issues with her cervical spine.  She has decreased her home exercise program compliance to no more than 3-4 times per week which has helped with overuse.  She is ready for transfer into independent rehabilitation.  Patient is a 73 y.o. female who was seen today for physical therapy evaluation and treatment for M54.2 (ICD-10-CM) - Cervicalgia.  Zoiey has chronic cervical spine pain that she has been dealing with for several years.  She appears to be very knowledgeable and compliant with her early physical therapy and would like some updates and tips to help her better manage her symptoms.  We also discussed possibly doing a shoulder screen on her next visit as her right upper arm pain might also have a shoulder contribution.  Postural limitations were most significant as her active range of motion only had mild impairments and her  cervical strength was actually quite good.  Will focus on postural and scapular strengthening, posture and body mechanics education and a shoulder screen on her neck supervised visit.  OBJECTIVE IMPAIRMENTS: decreased activity tolerance, decreased endurance, decreased ROM, decreased strength, impaired perceived functional ability, impaired UE functional use, improper body mechanics, postural dysfunction, and pain.   ACTIVITY LIMITATIONS: carrying, lifting, sleeping, and reach over head  PARTICIPATION LIMITATIONS: driving and community activity  PERSONAL FACTORS: Rt meniscus repair, HTN are also affecting patient's functional outcome.   REHAB POTENTIAL: Good  CLINICAL DECISION MAKING: Stable/uncomplicated  EVALUATION COMPLEXITY: Moderate   GOALS: Goals reviewed with patient? Yes  SHORT TERM GOALS: Target date: 11/05/2023  Akeila will be independent with her day 1 home exercise program Baseline: Started 10/08/2023 Goal status: Met 10/29/2023  2.  Improve cervical active range of motion for lateral bending to 25 degrees and rotation to 60 degrees Baseline: 25/20 and 55/45 respectively Goal status: Met 10/29/2023  3.  Saima will have an improved postural awareness and will be able to complete this into all static and dynamic activities Baseline: See postural abnormalities noted above Goal status: Met 11/04/2023   LONG TERM GOALS: Target date: 01/03/2024  Improve patient's specific functional scale to an average of 8 Baseline: Average of 5 Goal status: Met 11/25/2023 2.  Improve cervical, right upper trapezius, scapular and right arm pain to consistently 0-2/10 on the visual analog scale Baseline: 0-3/10 Goal status: Met 11/25/2023  3.  Improve scapular and postural strength as assessed by performance with functional activities and subjective self-report Baseline: See postural abnormalities above Goal status: Met 12/29/2023  4.  Betzy will be independent with her long-term maintenance  home exercise program Baseline: Started 10/08/2023 Goal status: Met 12/29/2023  PLAN:  PT FREQUENCY: DC  PT DURATION: DC  PLANNED INTERVENTIONS: 97110-Therapeutic exercises, 97530- Therapeutic activity, 97112- Neuromuscular re-education, 97535- Self Care, 02859- Manual therapy, 97012- Traction (mechanical), Patient/Family education, Dry Needling, Cryotherapy, and Moist heat  PLAN FOR NEXT SESSION: DC   Myer LELON Ivory, PT, MPT 12/29/2023, 12:48 PM

## 2024-01-31 ENCOUNTER — Other Ambulatory Visit: Payer: Self-pay

## 2024-01-31 DIAGNOSIS — M170 Bilateral primary osteoarthritis of knee: Secondary | ICD-10-CM

## 2024-02-02 ENCOUNTER — Ambulatory Visit: Admitting: Sports Medicine

## 2024-02-08 ENCOUNTER — Encounter: Payer: Self-pay | Admitting: Sports Medicine

## 2024-02-08 ENCOUNTER — Telehealth: Payer: Self-pay | Admitting: Sports Medicine

## 2024-02-08 NOTE — Telephone Encounter (Signed)
 Patient called and said she needs you to reorder the patches. CB#[phone]

## 2024-02-10 ENCOUNTER — Other Ambulatory Visit: Payer: Self-pay

## 2024-02-10 ENCOUNTER — Ambulatory Visit (INDEPENDENT_AMBULATORY_CARE_PROVIDER_SITE_OTHER): Admitting: Sports Medicine

## 2024-02-10 ENCOUNTER — Encounter: Payer: Self-pay | Admitting: Sports Medicine

## 2024-02-10 DIAGNOSIS — M170 Bilateral primary osteoarthritis of knee: Secondary | ICD-10-CM | POA: Diagnosis not present

## 2024-02-10 DIAGNOSIS — M7051 Other bursitis of knee, right knee: Secondary | ICD-10-CM

## 2024-02-10 DIAGNOSIS — G8929 Other chronic pain: Secondary | ICD-10-CM

## 2024-02-10 MED ORDER — HYALURONAN 88 MG/4ML IX SOSY
88.0000 mg | PREFILLED_SYRINGE | INTRA_ARTICULAR | Status: AC | PRN
  Administered 2024-02-10: 88 mg via INTRA_ARTICULAR

## 2024-02-10 MED ORDER — LIDOCAINE HCL 1 % IJ SOLN
2.0000 mL | INTRAMUSCULAR | Status: AC | PRN
  Administered 2024-02-10: 2 mL

## 2024-02-10 MED ORDER — BUPIVACAINE HCL 0.25 % IJ SOLN
2.0000 mL | INTRAMUSCULAR | Status: AC | PRN
  Administered 2024-02-10: 2 mL via INTRA_ARTICULAR

## 2024-02-10 NOTE — Progress Notes (Signed)
 Sharon Mack - 73 y.o. female MRN [id number]  Date of birth: 1951-05-16  Office Visit Note: Visit Date: 02/10/2024 PCP: Pcp, No Referred by: No ref. provider found  Subjective: Chief Complaint  Patient presents with   Left Knee - Pain   Right Knee - Pain   HPI: Sharon Mack is a pleasant 73 y.o. female who presents today for chronic bilateral knee pain with knee osteoarthritis.  Sharon Mack has known osteoarthritis of bilateral knees, she has been involved in physical therapy and is quite active on her own. She also sees Dr. Kayla over in [place] and did receive [employer] PTM injection therapy for the right knee on 09/28/23. Did not tolerate PRP in past - swelling reaction.  She has underwent viscosupplementation just over 6 months ago and did present today for planned right and left knee viscosupplementation.  She also has been having pain lower in the leg near the pes anserine bursa region.  She is inquiring about other noninvasive treatment options, such as shockwave and would like additional information.  Pertinent ROS were reviewed with the patient and found to be negative unless otherwise specified above in HPI.   Assessment & Plan: Visit Diagnoses:  1. Bilateral primary osteoarthritis of knee   2. Bilateral chronic knee pain   3. Pes anserinus bursitis of both knees    Plan: Impression is bilateral knee osteoarthritis, right > left.  She has benefited in the past from hyaluronic acid/viscosupplementation, under ultrasound guidance we did proceed with Monovisc injection for the right and left knee, patient tolerated well.  While discussing the injection, Barnie did make it knowing that in the past she did receive the 5-shot series of Hyalgan previously.  I did discuss with her that both are forms of hyaluronic acid, however Monovisc is a single dose injection with a higher molecular weight.  In the future, granting insurance approval, she does prefer her Hyalgan  going forward. Discussed post-injection protocol. May use ice/heat, and/or tylenol  as needed for first few days.  She has progressed through physical therapy and she will continue her home exercises on her own accord.  We did address her additional knee pain which localizes to the Pes anserine bursa -she is interested in additional noninvasive treatment, did discuss extracorporeal shockwave therapy and how trialing this for the insertion of her 3 tendons in this location would be worthwhile.  She is interested in this treatment, I did write this down so she can do her own research prior to our appointment.  I will see her back in about 2 weeks to initiate this treatment and reevaluate the knees.  She has been working with our rep using SAM device, which she has been finding significantly helpful for her knees and shoulder, she is working with their team on ordering additional patches.  Meds & Orders: No orders of the defined types were placed in this encounter.   Orders Placed This Encounter  Procedures   Large Joint Inj: R knee   Large Joint Inj: L knee   US  Guided Needle Placement - No Linked Charges     Procedures: Large Joint Inj: R knee on 02/10/2024 3:28 PM Indications: pain Details: 22 G 1.5 in needle, ultrasound-guided superolateral approach Medications: 2 mL lidocaine  1 %; 2 mL bupivacaine  0.25 %; 88 mg Hyaluronan 88 MG/4ML Outcome: tolerated well, no immediate complications  *Procedurally successful ultrasound-guided right knee hyaluronic acid (Monovisc) injection Procedure, treatment alternatives, risks and benefits explained, specific risks discussed. Consent was given by the  patient. Patient was prepped and draped in the usual sterile fashion.    Large Joint Inj: L knee on 02/10/2024 3:29 PM Indications: pain Details: 22 G 1.5 in needle, ultrasound-guided superolateral approach Medications: 2 mL lidocaine  1 %; 2 mL bupivacaine  0.25 %; 88 mg Hyaluronan 88 MG/4ML Outcome:  tolerated well, no immediate complications  Procedurally successful ultrasound-guided left knee hyaluronic acid (Monovisc) injection Procedure, treatment alternatives, risks and benefits explained, specific risks discussed. Consent was given by the patient. Patient was prepped and draped in the usual sterile fashion.          Clinical History: No specialty comments available.  She reports that she has never smoked. She has never used smokeless tobacco. No results for input(s): HGBA1C, LABURIC in the last 8760 hours.  Objective:    Physical Exam  Gen: Well-appearing, in no acute distress; non-toxic CV: Well-perfused. Warm.  Resp: Breathing unlabored on room air; no wheezing. Psych: Fluid speech in conversation; appropriate affect; normal thought process  Ortho Exam - Bilateral knees: Inspection of the knees demonstrates no significant effusion bilaterally.  There is relatively well-preserved flexion and full range extension bilaterally.  Palpating the region of the Pes anserine bursa there is tenderness of both knees with right > left. + Patellofemoral crepitus present b/l.  Imaging: No results found.  Past Medical/Family/Surgical/Social History: Medications & Allergies reviewed per EMR, new medications updated. Patient Active Problem List   Diagnosis Date Noted   Elevated blood pressure 01/05/2016   Weight gain 01/05/2016   Past Medical History:  Diagnosis Date   Hypertension    Family History  Problem Relation Age of Onset   Alcohol abuse Mother        died from liver cirrhosis, at age 35   Past Surgical History:  Procedure Laterality Date   KNEE ARTHROSCOPY WITH MENISCAL REPAIR  2014   Sub Urethral Cyst     URETHRAL CYST REMOVAL     Social History   Occupational History   Not on file  Tobacco Use   Smoking status: Never   Smokeless tobacco: Never  Substance and Sexual Activity   Alcohol use: No   Drug use: No   Sexual activity: Not on file

## 2024-02-23 ENCOUNTER — Encounter: Payer: Self-pay | Admitting: Radiology

## 2024-02-25 ENCOUNTER — Ambulatory Visit: Admitting: Sports Medicine

## 2024-02-25 ENCOUNTER — Telehealth: Payer: Self-pay | Admitting: Radiology

## 2024-02-25 NOTE — Telephone Encounter (Signed)
 Patient came into office today.  She wanted to speak with someone about her recent dismissal from our practice. I spoke with her in detail about the reason for dismissal.  Copies of emails she sent will be scanned into her chart for reference.  She says she has since spoken with the sales reps and folks with [employer], and apologized.  I advised her that is fine, but our decision was to dismiss her from our practice as we do not condone the inappropriate communication that came from her to [employer] folks.  She appears sincere in her apology, however while not really addressing her communication. She has the complete working device now for her knee.  She wants to continue seeing Dr Burnetta, and would like to be reconsidered as our patient in this matter.  I advised I would pass this along to Dr Burnetta to advise further on.

## 2024-03-21 ENCOUNTER — Emergency Department (HOSPITAL_BASED_OUTPATIENT_CLINIC_OR_DEPARTMENT_OTHER): Admitting: Radiology

## 2024-03-21 ENCOUNTER — Other Ambulatory Visit: Payer: Self-pay

## 2024-03-21 ENCOUNTER — Emergency Department (HOSPITAL_BASED_OUTPATIENT_CLINIC_OR_DEPARTMENT_OTHER)
Admission: EM | Admit: 2024-03-21 | Discharge: 2024-03-21 | Disposition: A | Discharge status: Home / Self Care | Attending: Emergency Medicine | Admitting: Emergency Medicine

## 2024-03-21 DIAGNOSIS — X500XXA Overexertion from strenuous movement or load, initial encounter: Secondary | ICD-10-CM | POA: Diagnosis not present

## 2024-03-21 DIAGNOSIS — M545 Low back pain, unspecified: Secondary | ICD-10-CM | POA: Insufficient documentation

## 2024-03-21 DIAGNOSIS — Z79899 Other long term (current) drug therapy: Secondary | ICD-10-CM | POA: Diagnosis not present

## 2024-03-21 DIAGNOSIS — I10 Essential (primary) hypertension: Secondary | ICD-10-CM | POA: Diagnosis not present

## 2024-03-21 MED ORDER — LIDOCAINE 5 % EX PTCH: 1.0000 | MEDICATED_PATCH | CUTANEOUS | 0 refills | Status: AC

## 2024-03-21 NOTE — ED Provider Notes (Signed)
 [facility]GENC[facility] DEPARTMENT AT [facility] Provider Note   CSN: [id number] Arrival date & time: 03/21/24  1510     Patient presents with: No chief complaint on file.   Sharon Mack is a 73 y.o. female.  HPI Patient is a 73 year old female presenting today for 3-week history of right-sided low back pain that has been present ever since she is doing PT at home, lifting with bands.  Reports that the pain feels sore.  Notably intermittent, worse with her exercise.  Patient history of HTN.  Denies fever, headache, vision change, chest pain, shortness breath, abdominal pain, nausea, vomiting, diarrhea, dysuria, hematuria, melena, hematochezia, vaginal discharge, vaginal bleeding, lower leg swelling, rashes.    Prior to Admission medications   Medication Sig Start Date End Date Taking? Authorizing Provider  lidocaine  (LIDODERM ) 5 % Place 1 patch onto the skin daily. Remove & Discard patch within 12 hours or as directed by MD 03/21/24  Yes Beola, Nissa Stannard S, PA-C  acetaminophen -codeine  (TYLENOL  #3) 300-30 MG tablet Take 1 tablet by mouth every 6 (six) hours as needed for moderate pain. 05/01/19   Nivia Colon, PA-C  Black Currant Seed Oil 500 MG CAPS Take by mouth.    [provider]  Cholecalciferol (VITAMIN D3 PO) Take by mouth.    [provider]  Coenzyme Q10 (COQ10 PO) Take by mouth.    [provider]  doxycycline  (VIBRAMYCIN ) 100 MG capsule Take 1 capsule (100 mg total) by mouth 2 (two) times daily. 12/08/19   Darr, Jacob, PA-C  enalapril  (VASOTEC ) 20 MG tablet Take 1 tablet (20 mg total) by mouth daily. 03/10/18   Rolinda Rogue, MD  enalapril  (VASOTEC ) 5 MG tablet Take 5 mg by mouth daily.    [provider]  Flaxseed, Linseed, (FLAXSEED OIL PO) Take by mouth.    [provider]  fluticasone  (FLONASE ) 50 MCG/ACT nasal spray Place 1 spray into both nostrils daily. 12/08/19   Darr, Jacob, PA-C  Ginkgo Biloba (GINKGO PO)  Take by mouth.    [provider]  ibuprofen  (ADVIL ,MOTRIN ) 800 MG tablet Take 1 tablet (800 mg total) by mouth 3 (three) times daily. 03/10/18   Rolinda Rogue, MD  MELATONIN PO Take by mouth at bedtime as needed.    [provider]  predniSONE  (DELTASONE ) 20 MG tablet 3 tabs po day one, then 2 tabs daily x 4 days 05/01/19   Nivia Colon, PA-C  VITAMIN E PO Take by mouth.    [provider]    Allergies: Toradol [ketorolac tromethamine] and Penicillins    Review of Systems  Musculoskeletal:  Positive for back pain.  All other systems reviewed and are negative.   Updated Vital Signs BP (!) 161/113   Pulse 83   Temp (!) 97.5 F (36.4 C)   Resp 17   SpO2 97%   Physical Exam Vitals and nursing note reviewed.  Constitutional:      General: She is not in acute distress.    Appearance: Normal appearance. She is not ill-appearing or diaphoretic.  HENT:     Head: Normocephalic and atraumatic.  Eyes:     General: No scleral icterus.       Right eye: No discharge.        Left eye: No discharge.     Extraocular Movements: Extraocular movements intact.     Conjunctiva/sclera: Conjunctivae normal.  Cardiovascular:     Rate and Rhythm: Normal rate and regular rhythm.     Pulses: Normal  pulses.     Heart sounds: Normal heart sounds. No murmur heard.    No friction rub. No gallop.  Pulmonary:     Effort: Pulmonary effort is normal. No respiratory distress.     Breath sounds: No stridor. No wheezing, rhonchi or rales.  Chest:     Chest wall: No tenderness.  Abdominal:     General: Abdomen is flat. There is no distension.     Palpations: Abdomen is soft.     Tenderness: There is no abdominal tenderness. There is no right CVA tenderness, left CVA tenderness, guarding or rebound.  Musculoskeletal:        General: Tenderness (Focal tenderness noted to right low back over paraspinal muscle, floating ribs.) present. No swelling, deformity or signs of injury.      Cervical back: Normal range of motion. No rigidity.     Right lower leg: No edema.     Left lower leg: No edema.  Skin:    General: Skin is warm and dry.     Findings: No bruising, erythema, lesion or rash.  Neurological:     General: No focal deficit present.     Mental Status: She is alert and oriented to person, place, and time. Mental status is at baseline.     Sensory: No sensory deficit.     Motor: No weakness.  Psychiatric:        Mood and Affect: Mood normal.     (all labs ordered are listed, but only abnormal results are displayed) Labs Reviewed - No data to display  EKG: None  Radiology: DG Ribs Unilateral W/Chest Right Result Date: 03/21/2024 EXAM: XR Ribs and AP Chest 03/21/2024 04:13:37 PM COMPARISON: None available. CLINICAL HISTORY: Tenderness in lower ribs. Triage note;; Getting PT for shoulder and neck pains. Now complaining of right flank pains several weeks now. Tenderness of ribs of right side. No urinary symptoms noted. Denies CP SOB. FINDINGS: BONES: No acute displaced rib fracture. LUNGS AND PLEURA: No consolidation or pulmonary edema. No pleural effusion or pneumothorax. HEART AND MEDIASTINUM: No acute abnormality of the cardiac and mediastinal silhouettes. IMPRESSION: 1. No acute rib fracture. 2. No acute process in the lungs. Electronically signed by: Greig Pique MD 03/21/2024 04:32 PM EDT RP Workstation: [id number]    Procedures   Medications Ordered in the ED - No data to display   Medical Decision Making Amount and/or Complexity of Data Reviewed Radiology: ordered.   This patient is a 73 year old female who presents to the ED for concern of right-sided low back pain, focally tender over floating ribs and paraspinal region of right lumbar spine.  Noted to have happened intermittently for the last 3 weeks, worse with her exercise.  Does notably wear tight clothing as well.  On physical exam, patient is in no acute distress, afebrile, alert and  orient x 4, speaking in full sentences, nontachypneic, nontachycardic.  Focally tender over right low back, across floating ribs, with no obvious deformities, rashes, erythema, ecchymosis.  No CVA tenderness, no abdominal detail patient, no chest wall tenderness noted otherwise.  Unremarkable exam.  With patient's well-appearing presentation, x-rays were done which not show any abnormality at this time.  Had shared decision making that no further lab work or imaging will be necessary at this time, symptomatically treat with lidocaine  patch, follow-up with PCP for follow-up labs for any persistent pain.  Low suspicion for ACS, PE, pyelonephritis, nephrolithiasis, AAA, AAS, diverticulitis, cholecystitis, hepatitis.  Patient vital signs have remained stable throughout  the course of patient's time in the ED. Low suspicion for any other emergent pathology at this time. I believe this patient is safe to be discharged. Provided strict return to ER precautions. Patient expressed agreement and understanding of plan. All questions were answered.  Differential diagnoses prior to evaluation: The emergent differential diagnosis includes, but is not limited to, muscle strain, AAA, renal vascular thrombosis, mesenteric ischemia, pyelonephritis, nephrolithiasis, cystitis, biliary colic, pancreatitis, PUD, appendicitis, diverticulitis, bowel obstruction, PID/TOA, Ovarian cyst, Ovarian torsion  . This is not an exhaustive differential.   Past Medical History / Co-morbidities / Social History: HTN  Additional history: Chart reviewed. Pertinent results include:   Last seen by sports medicine on 02/10/2024 for bilateral osteo arthritis of knees.  Provided knee injections at that time.  Lab Tests/Imaging studies: I personally interpreted labs/imaging and the pertinent results include:    X-ray of chest with new lateral right shows no acute abnormalities.   I agree with the radiologist  interpretation.  Medications: I ordered medication including Lidocaine  patch.  I have reviewed the patients home medicines and have made adjustments as needed.  Critical Interventions: None  Social Determinants of Health: Does not have PCP at this time  Disposition: After consideration of the diagnostic results and the patients response to treatment, I feel that the patient would benefit from discharge and treatment as above.   emergency department workup does not suggest an emergent condition requiring admission or immediate intervention beyond what has been performed at this time. The plan is: Establish care with PCP, symptomatic management home, return to the ER for new or worsening symptoms. The patient is safe for discharge and has been instructed to return immediately for worsening symptoms, change in symptoms or any other concerns.   Final diagnoses:  Acute right-sided low back pain without sciatica    ED Discharge Orders          Ordered    lidocaine  (LIDODERM ) 5 %  Every 24 hours        03/21/24 2104               Beola Terrall RAMAN, PA-C 03/21/24 2112    Jerrol Agent, MD 03/22/24 1225

## 2024-03-21 NOTE — Discharge Instructions (Addendum)
 You were seen today for right sided flank pain.  Suspicious that this is likely due to either muscle strain or bony tenderness from exercise.  Your x-rays today and fetal exam were very reassuring that low suspicion for any emergent cause recent today.  However would recommend continued follow-up with your PCP if pain persists as she would likely need further follow-up imaging and labs/reevaluation.  I am sending lidocaine  patches into your pharmacy.  Additionally can use Tylenol  for further relief. Take Tylenol  (acetominophen)  650mg  every 4-6 hours, as needed for pain or fever. Do not take more than 4,000 mg in a 24-hour period. As this may cause liver damage. While this is rare, if you begin to develop yellowing of the skin or eyes, stop taking and return to ER immediately.

## 2024-03-21 NOTE — ED Triage Notes (Signed)
 Getting PT for shoulder and neck pains. Now complaining of right flank pains several weeks now. Tenderness of ribs of right side. No urinary symptoms noted. Denies CP SOB.

## 2024-03-21 NOTE — ED Notes (Signed)
 Questions and concerns addressed. Discharge teaching completed.   Prescriptions reviewed and pharmacy verified.   Pt ambulatory upon discharge.

## 2024-03-27 ENCOUNTER — Encounter (INDEPENDENT_AMBULATORY_CARE_PROVIDER_SITE_OTHER): Payer: Self-pay | Admitting: Radiology
